# Patient Record
Sex: Female | Born: 1960 | Hispanic: No | Marital: Married | Smoking: Never smoker
Health system: Southern US, Community
[De-identification: ages and names within clinical notes are randomized; demographics above are authoritative.]

## PROBLEM LIST (undated history)

## (undated) DIAGNOSIS — Z780 Asymptomatic menopausal state: Secondary | ICD-10-CM

## (undated) DIAGNOSIS — E041 Nontoxic single thyroid nodule: Secondary | ICD-10-CM

## (undated) HISTORY — DX: Asymptomatic menopausal state: Z78.0

## (undated) HISTORY — DX: Nontoxic single thyroid nodule: E04.1

---

## 2004-07-30 ENCOUNTER — Other Ambulatory Visit: Admission: RE | Admit: 2004-07-30 | Discharge: 2004-07-30 | Payer: Self-pay | Admitting: Obstetrics and Gynecology

## 2005-08-05 ENCOUNTER — Other Ambulatory Visit: Admission: RE | Admit: 2005-08-05 | Discharge: 2005-08-05 | Payer: Self-pay | Admitting: Obstetrics and Gynecology

## 2008-05-13 ENCOUNTER — Ambulatory Visit: Payer: Self-pay | Admitting: Interventional Radiology

## 2008-05-13 ENCOUNTER — Emergency Department (HOSPITAL_BASED_OUTPATIENT_CLINIC_OR_DEPARTMENT_OTHER): Admission: EM | Admit: 2008-05-13 | Discharge: 2008-05-13 | Payer: Self-pay | Admitting: Emergency Medicine

## 2009-11-24 ENCOUNTER — Emergency Department (HOSPITAL_BASED_OUTPATIENT_CLINIC_OR_DEPARTMENT_OTHER): Admission: EM | Admit: 2009-11-24 | Discharge: 2009-11-24 | Payer: Self-pay | Admitting: Emergency Medicine

## 2009-11-24 ENCOUNTER — Ambulatory Visit: Payer: Self-pay | Admitting: Radiology

## 2009-11-28 IMAGING — CR DG HAND COMPLETE 3+V*R*
3 series · 3 of 3 positions shown · non-contrast
Comparison: None.

CLINICAL DATA: Fall, right wrist and hand pain

RIGHT HAND - COMPLETE 3+ VIEW

[x hand pa right]
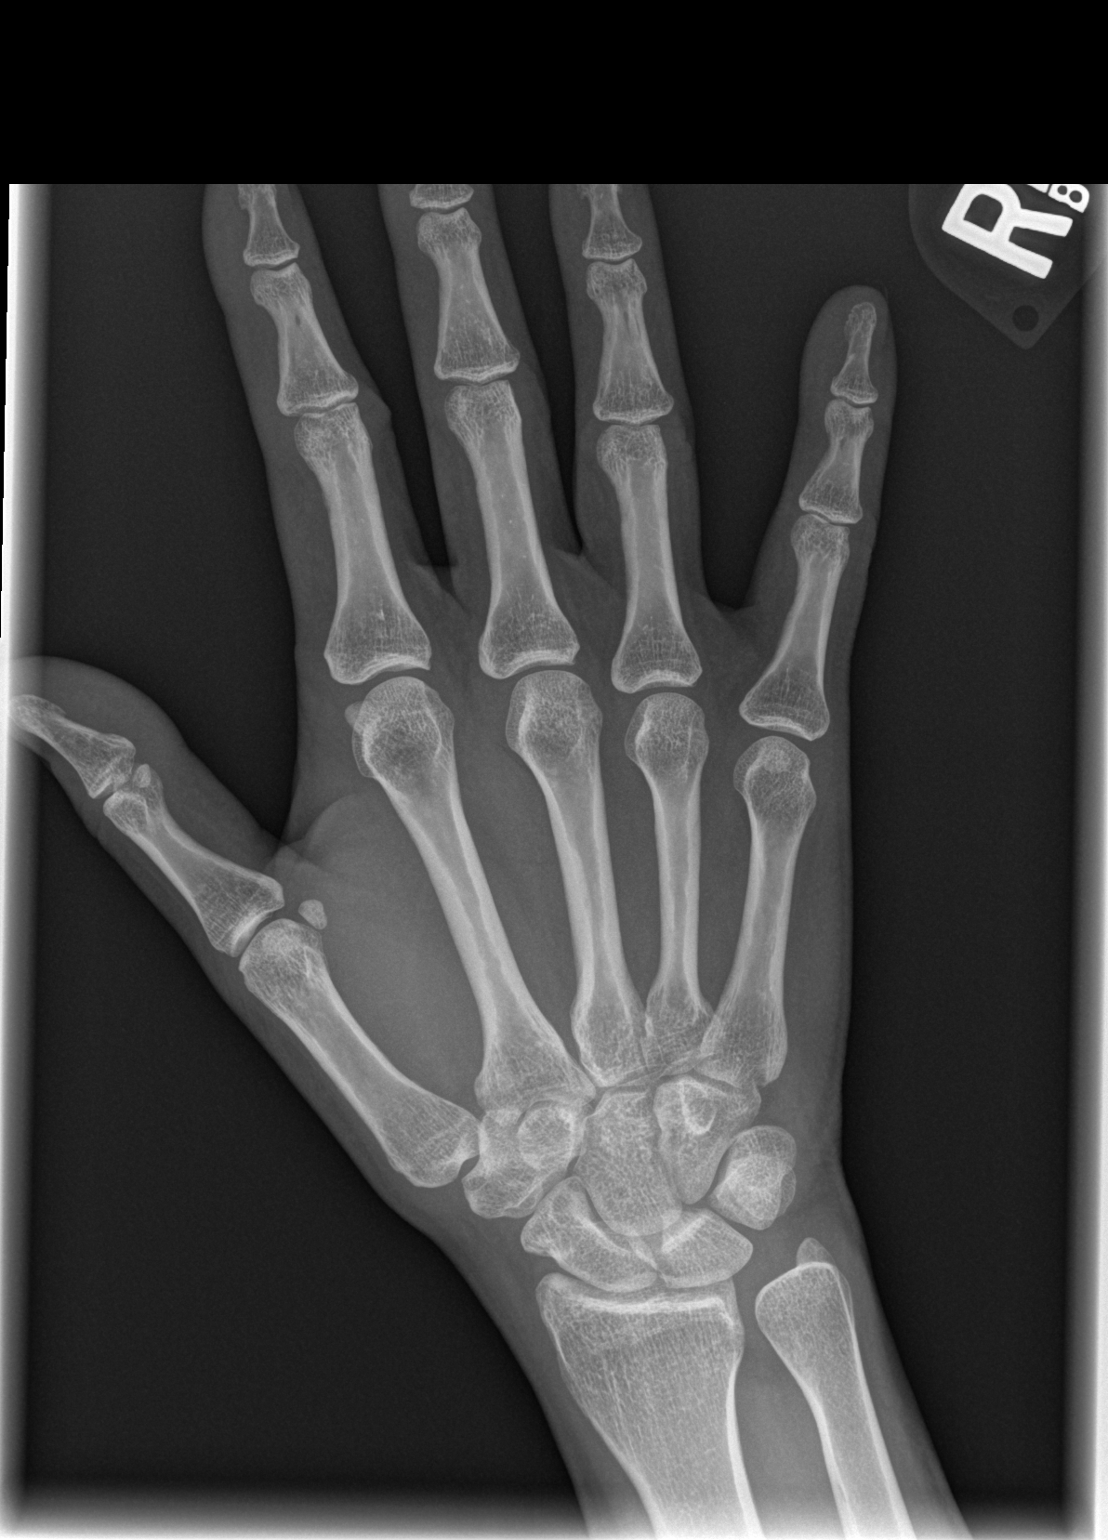

[x hand oblique right]
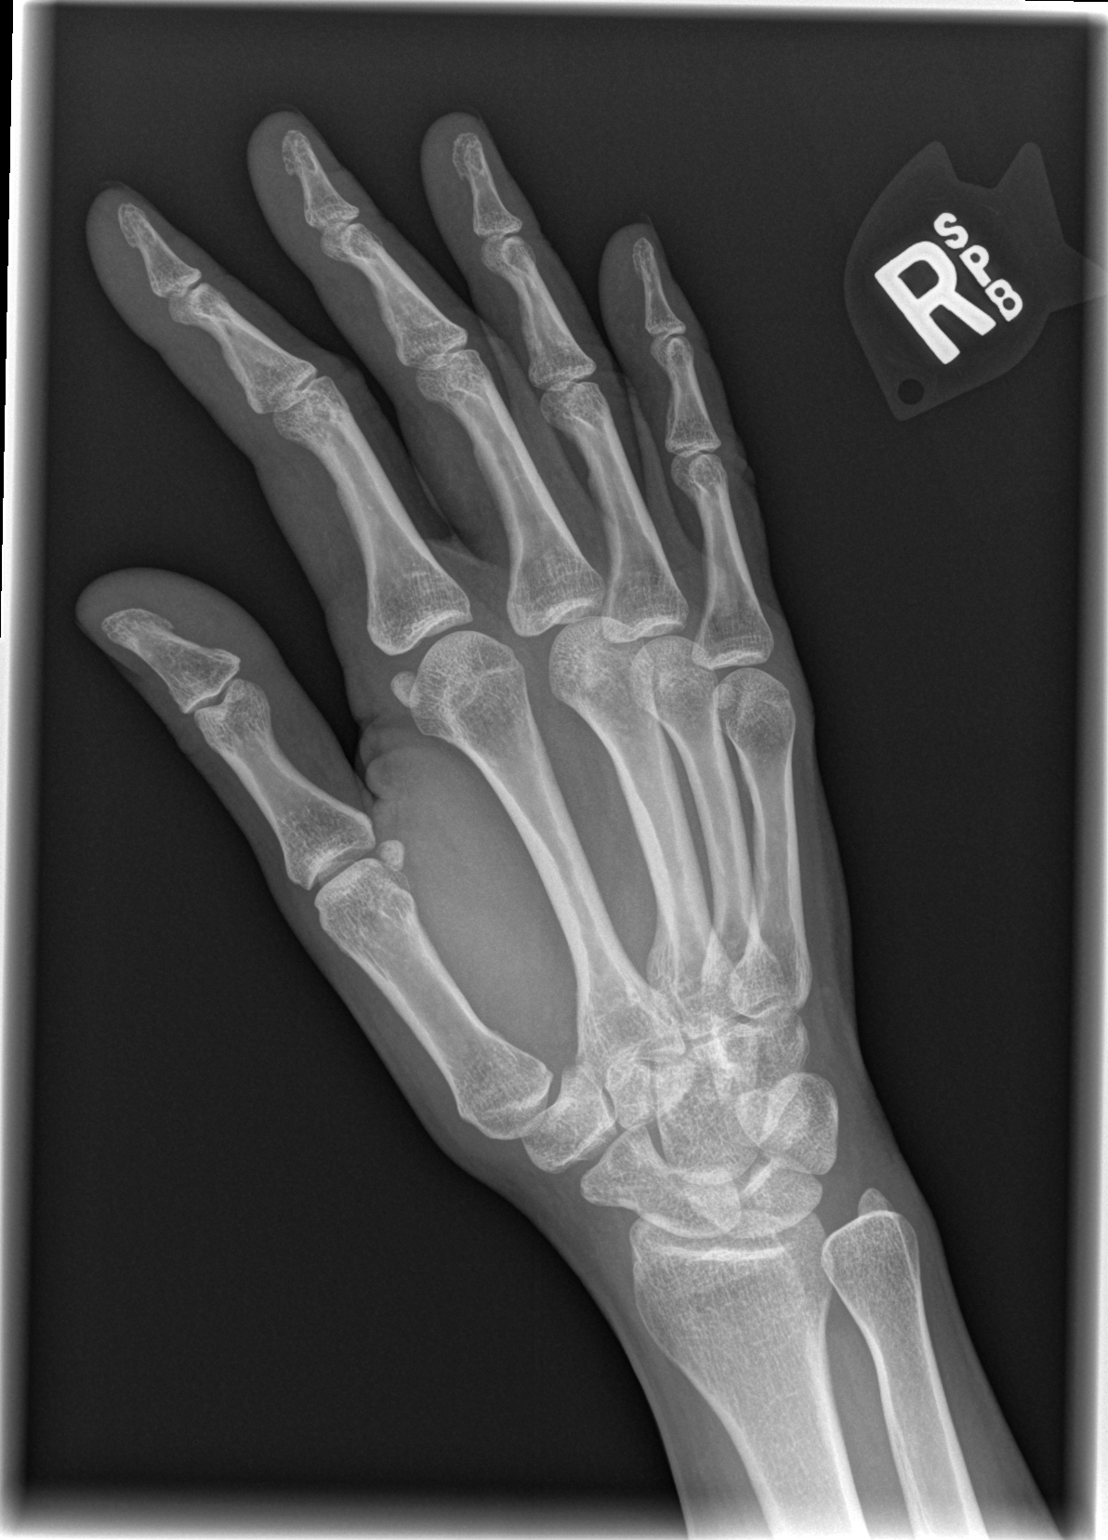

[x hand lat right]
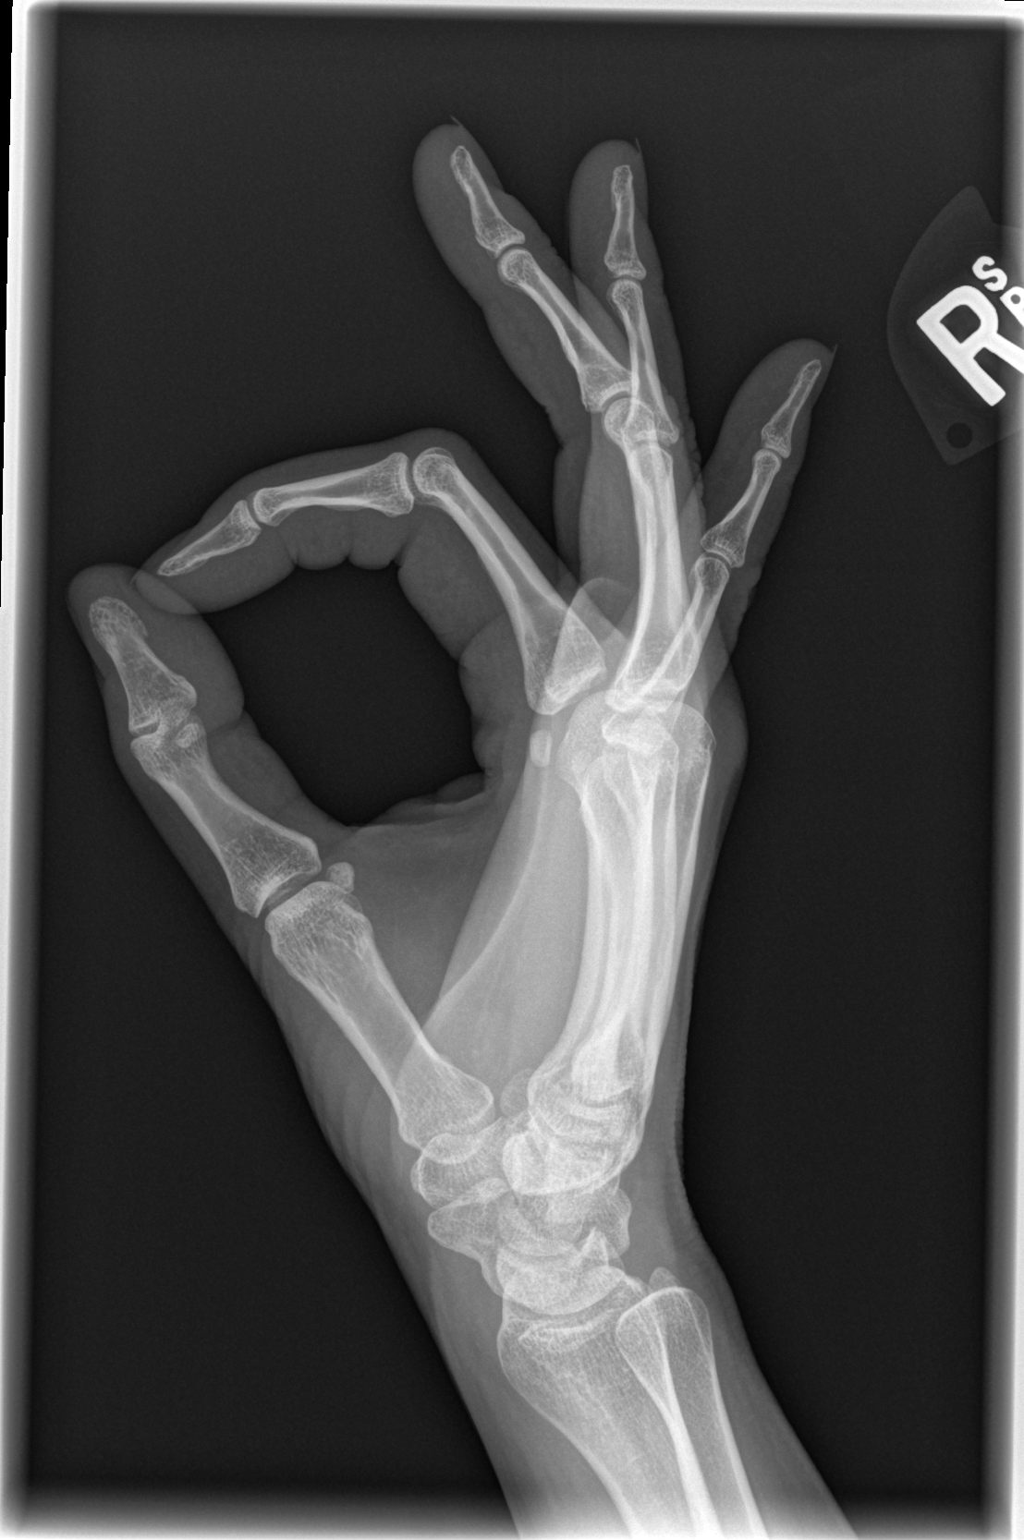

[3 of 3 positions shown; findings below may reference images not displayed]

FINDINGS: Normal alignment without fracture.  Preserved joint
spaces.  No radiographic swelling or foreign body.
IMPRESSION: No acute bony abnormality.

## 2009-11-28 IMAGING — CR DG WRIST COMPLETE 3+V*R*
3 series · 3 of 3 positions shown · non-contrast
Comparison: Same date

CLINICAL DATA: Fall, right wrist and hand pain

RIGHT WRIST - COMPLETE 3+ VIEW

[x wrist pa right]
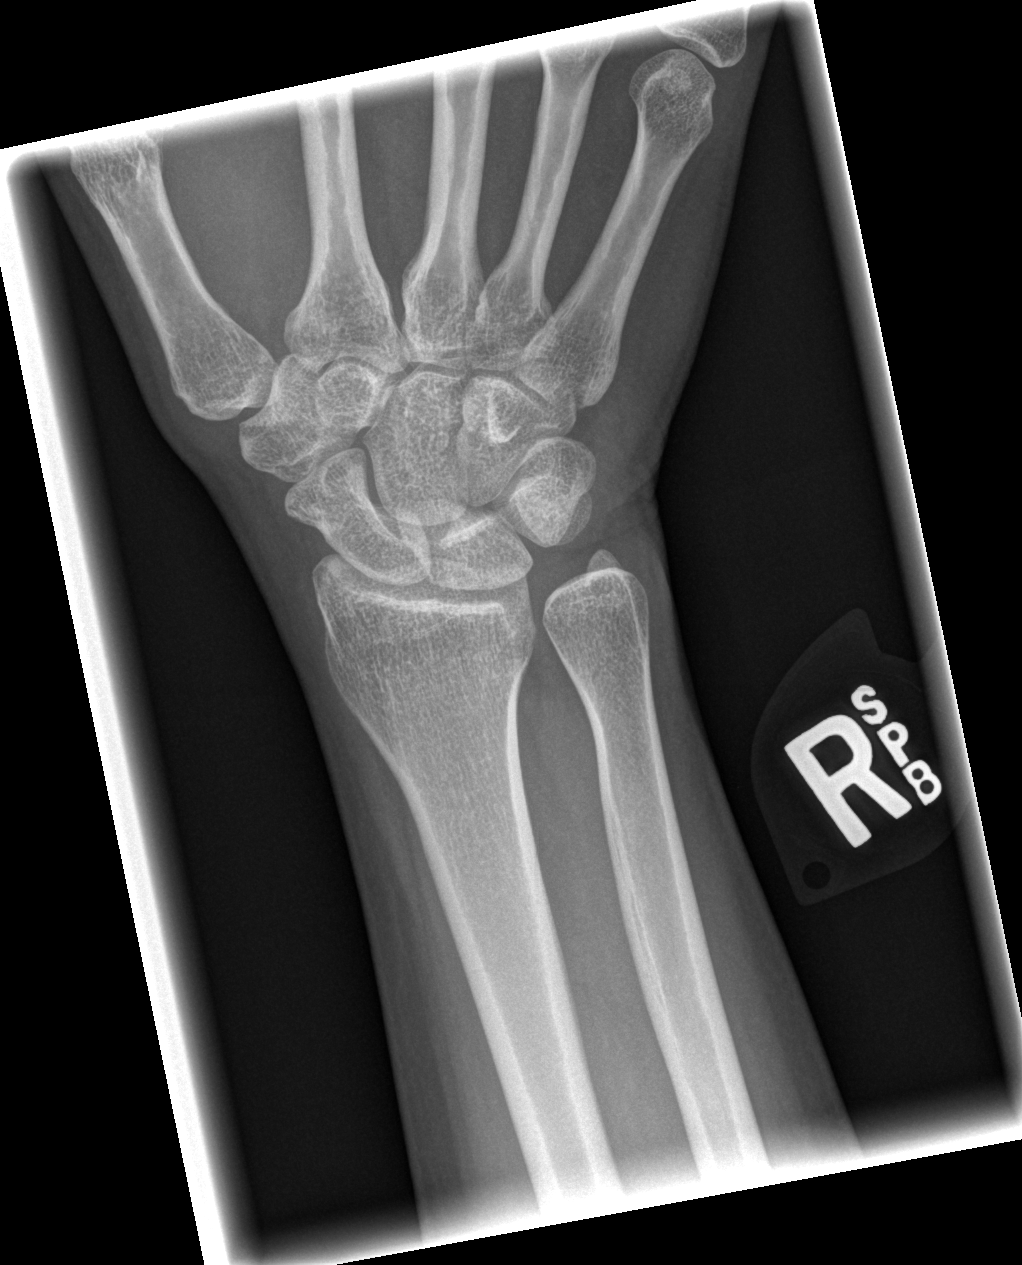

[x wrist obl right]
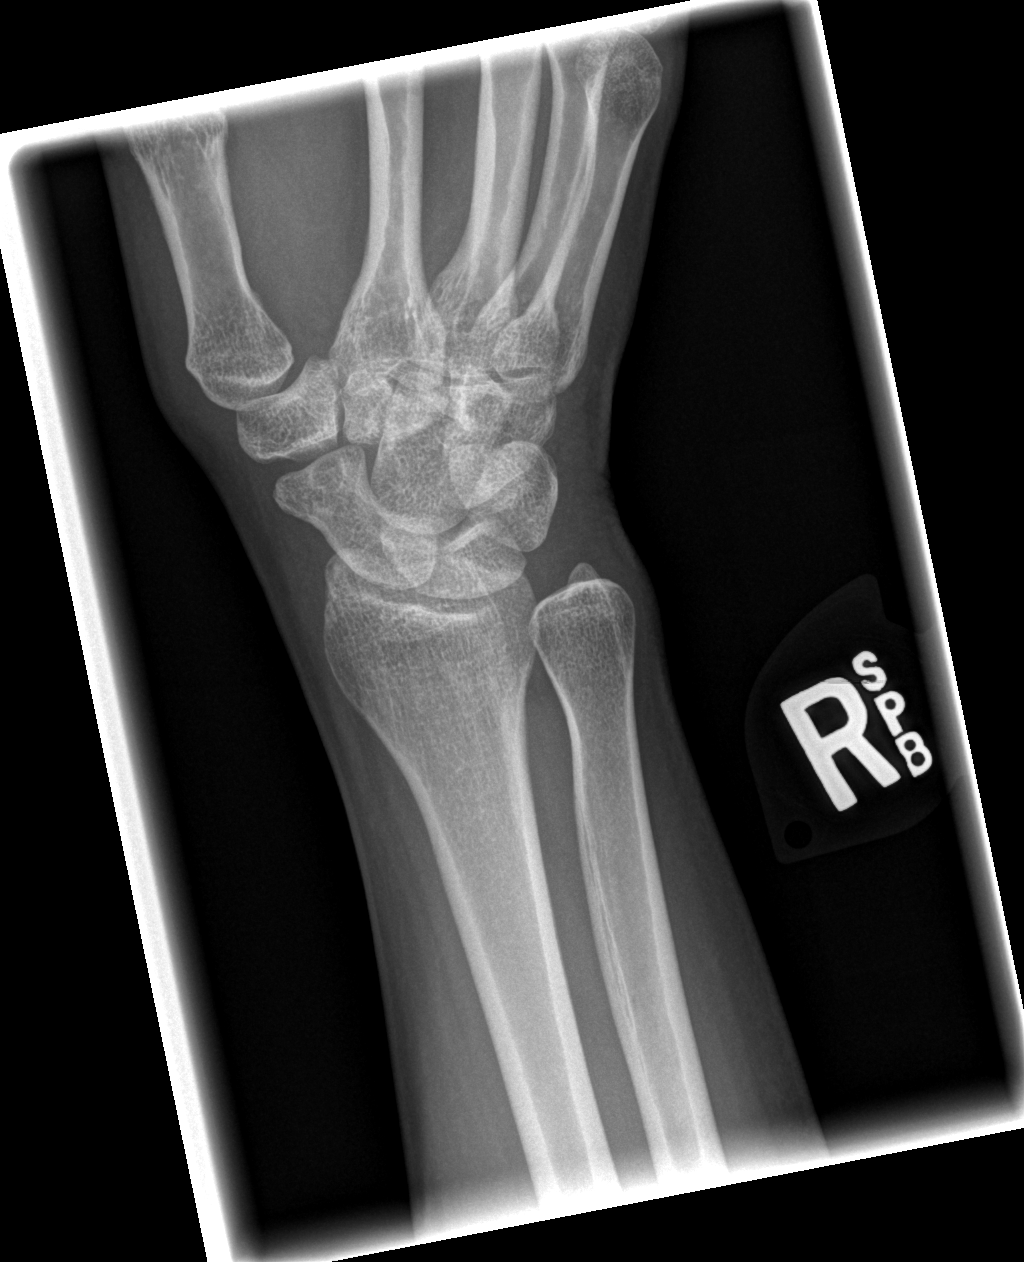

[x wrist lat right]
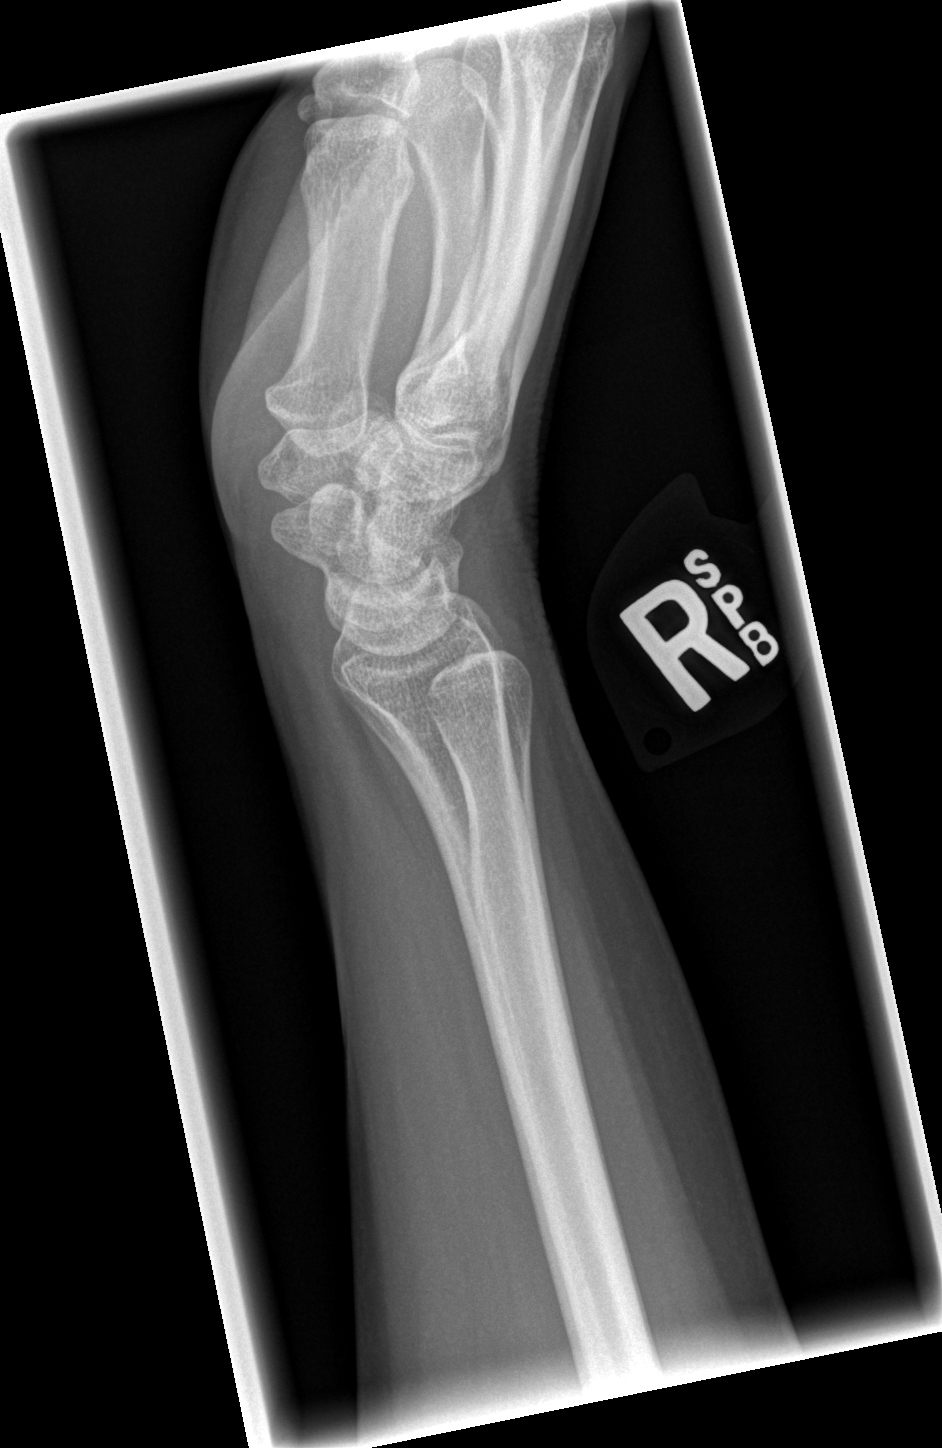

[3 of 3 positions shown; findings below may reference images not displayed]

FINDINGS: Normal alignment without fracture.  No radiographic
swelling or foreign body.  No significant arthritic or degenerative
change.
IMPRESSION: No acute bony abnormality

## 2010-01-09 ENCOUNTER — Ambulatory Visit: Payer: Self-pay | Admitting: Genetic Counselor

## 2010-07-25 ENCOUNTER — Encounter: Payer: Self-pay | Admitting: Internal Medicine

## 2010-07-25 ENCOUNTER — Ambulatory Visit (INDEPENDENT_AMBULATORY_CARE_PROVIDER_SITE_OTHER): Payer: BC Managed Care – PPO | Admitting: Internal Medicine

## 2010-07-25 DIAGNOSIS — D649 Anemia, unspecified: Secondary | ICD-10-CM | POA: Insufficient documentation

## 2010-07-25 DIAGNOSIS — M26609 Unspecified temporomandibular joint disorder, unspecified side: Secondary | ICD-10-CM

## 2010-07-25 DIAGNOSIS — J309 Allergic rhinitis, unspecified: Secondary | ICD-10-CM | POA: Insufficient documentation

## 2010-07-25 DIAGNOSIS — H6590 Unspecified nonsuppurative otitis media, unspecified ear: Secondary | ICD-10-CM | POA: Insufficient documentation

## 2010-07-30 NOTE — Assessment & Plan Note (Signed)
Summary: new acute ear problem/bcbs federal/ok per chrae/kn   Vital Signs:  Patient profile:   50 year old female Weight:      167 pounds Temp:     98.3 degrees F oral Pulse rate:   64 / minute Resp:     12 per minute BP sitting:   140 / 82  (left arm) Cuff size:   large  Vitals Entered By: Shonna Chock CMA (July 25, 2010 3:51 PM) CC: New patient Acute: Left side jaw pain up into left ear , Ear pain   CC:  New patient Acute: Left side jaw pain up into left ear  and Ear pain.  History of Present Illness:    Onset of dull pain in front of L ear 2 weeks ago w/o trigger or injury. She  reports sensation of fullness, tinnitus as intermittent buzzing, and jaw click, but denies ear discharge, hearing loss, fever, sinus pain, and nasal discharge.  The pain is described as constant and severe since she ate an apple 10 days ago.  The patient denies popping or crackling sounds, toothache, dizziness, and vertigo.  Prior treatment has included  use of Q Tip.  Preventive Screening-Counseling & Management  Alcohol-Tobacco     Smoking Status: never  Caffeine-Diet-Exercise     Does Patient Exercise: yes  Current Medications (verified): 1)  Kariva 0.15-0.02/0.01 Mg (21/5) Tabs (Desogestrel-Ethinyl Estradiol) .... As Directed  Allergies (verified): 1)  ! Pcn  Past History:  Past Medical History: Allergic rhinitis, PMH of in chilfdhood Anemia, Blood Donor  Past Surgical History: Tonsillectomy; G 1 P 1  Family History: Father: Living, bladder cancer Mother: Living, DM, HTN Siblings: 1 sister: Deceased Ovarian cancer; MGM : Ovarian cancer  Social History: Occupation: Therapist, music) Married Never Smoked Alcohol use-yes: socially Regular exercise-yes: walking 3X/ week Smoking Status:  never Does Patient Exercise:  yes  Physical Exam  General:  Thin but well-nourished,in no acute distress; alert,appropriate and cooperative throughout  examination Head:  Normocephalic and atraumatic without obvious abnormalities.  Eyes:  No corneal or conjunctival inflammation noted. EOMI. Perrla. No nystagmus Ears:  External ear exam shows no significant lesions or deformities.  Otoscopic examination reveals clear canals, tympanic membranes are intact bilaterally without bulging, retraction, inflammation or discharge. Hearing is grossly normal bilaterally to whisper @ 6 ft. L TM dull.Rinne normal (BC>AC) and Weber abnormal, not heard on L.   Nose:  External nasal examination shows no deformity or inflammation. Nasal mucosa are pink and moist without lesions or exudates. Mouth:  Oral mucosa and oropharynx without lesions or exudates.  Teeth in good repair. Classic TMJ on L Neck:  No deformities, masses, or tenderness noted. Thyroid ULN size w/o nodules Lungs:  Normal respiratory effort, chest expands symmetrically. Lungs are clear to auscultation, no crackles or wheezes. Heart:  Normal rate and regular rhythm. S1 and S2 normal without gallop, murmur, click, rub .S4 Pulses:  R and L carotid  pulses are full and equal bilaterally Neurologic:  alert & oriented X3, gait normal, finger-to-nose normal, and Romberg negative.   Skin:  Intact without suspicious lesions or rashes Cervical Nodes:  No lymphadenopathy noted Axillary Nodes:  No palpable lymphadenopathy Psych:  memory intact for recent and remote, normally interactive, and good eye contact.     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 As directed 2)  Tramadol Hcl 50 Mg Tabs (Tramadol hcl) .Marland Kitchen.. 1 every 6 hrs as needed for pain 3)  Fluticasone Propionate 50 Mcg/act Susp (Fluticasone propionate) .Marland Kitchen.. 1 spray two times a day as needed  Patient  Instructions: 1)  Soft /liquid diet until pain gone. Take Aleve 1-2 every 8 -12 hrs with food  as needed for jaw pain. Glucosamine sulfate 1500 mg once daily for 3-4 weeks. Warm , moist compresses 3-4X/ day as needed .  Prescriptions: FLUTICASONE PROPIONATE 50 MCG/ACT SUSP (FLUTICASONE PROPIONATE) 1 spray two times a day as needed  #1 x 2   Entered and Authorized by:   Marga Melnick MD   Signed by:   Marga Melnick MD on 07/25/2010   Method used:   Electronically to        [facility] (retail)       [address]       [place], [place]  [address]       Ph: [phone]       Fax: [phone]   RxID:   [id number] TRAMADOL HCL 50 MG TABS (TRAMADOL HCL) 1 every 6 hrs as needed for pain  #30 x 0   Entered and Authorized by:   Marga Melnick MD   Signed by:   Marga Melnick MD on 07/25/2010   Method used:   Electronically to        [facility] (retail)       [address]       [place], [place]  [address]       Ph: [phone]       Fax: [phone]   RxID:   [id number]    Orders Added: 1)  New Patient Level III [phone]

## 2010-10-08 ENCOUNTER — Other Ambulatory Visit: Payer: Self-pay | Admitting: Obstetrics and Gynecology

## 2010-10-08 DIAGNOSIS — R928 Other abnormal and inconclusive findings on diagnostic imaging of breast: Secondary | ICD-10-CM

## 2010-10-13 ENCOUNTER — Ambulatory Visit
Admission: RE | Admit: 2010-10-13 | Discharge: 2010-10-13 | Disposition: A | Discharge status: Home / Self Care | Payer: BC Managed Care – PPO | Source: Ambulatory Visit | Attending: Obstetrics and Gynecology | Admitting: Obstetrics and Gynecology

## 2010-10-13 DIAGNOSIS — R928 Other abnormal and inconclusive findings on diagnostic imaging of breast: Secondary | ICD-10-CM

## 2010-11-14 HISTORY — PX: COLONOSCOPY: SHX174

## 2011-03-31 ENCOUNTER — Other Ambulatory Visit: Payer: Self-pay | Admitting: Internal Medicine

## 2011-03-31 DIAGNOSIS — R221 Localized swelling, mass and lump, neck: Secondary | ICD-10-CM

## 2011-04-03 ENCOUNTER — Ambulatory Visit
Admission: RE | Admit: 2011-04-03 | Discharge: 2011-04-03 | Disposition: A | Discharge status: Home / Self Care | Payer: BC Managed Care – PPO | Source: Ambulatory Visit | Attending: Internal Medicine | Admitting: Internal Medicine

## 2011-04-03 DIAGNOSIS — R221 Localized swelling, mass and lump, neck: Secondary | ICD-10-CM

## 2012-04-06 ENCOUNTER — Other Ambulatory Visit: Payer: Self-pay | Admitting: Internal Medicine

## 2012-04-06 DIAGNOSIS — E041 Nontoxic single thyroid nodule: Secondary | ICD-10-CM

## 2012-04-12 ENCOUNTER — Ambulatory Visit
Admission: RE | Admit: 2012-04-12 | Discharge: 2012-04-12 | Disposition: A | Discharge status: Home / Self Care | Payer: Federal, State, Local not specified - PPO | Source: Ambulatory Visit | Attending: Internal Medicine | Admitting: Internal Medicine

## 2012-04-12 DIAGNOSIS — E041 Nontoxic single thyroid nodule: Secondary | ICD-10-CM

## 2012-04-19 ENCOUNTER — Other Ambulatory Visit: Payer: Self-pay | Admitting: Internal Medicine

## 2012-04-19 DIAGNOSIS — E041 Nontoxic single thyroid nodule: Secondary | ICD-10-CM

## 2012-04-21 ENCOUNTER — Ambulatory Visit
Admission: RE | Admit: 2012-04-21 | Discharge: 2012-04-21 | Disposition: A | Discharge status: Home / Self Care | Payer: Federal, State, Local not specified - PPO | Source: Ambulatory Visit | Attending: Internal Medicine | Admitting: Internal Medicine

## 2012-04-21 DIAGNOSIS — E041 Nontoxic single thyroid nodule: Secondary | ICD-10-CM

## 2012-05-02 ENCOUNTER — Other Ambulatory Visit: Payer: Self-pay | Admitting: Dermatology

## 2012-06-09 ENCOUNTER — Other Ambulatory Visit: Payer: Self-pay | Admitting: Dermatology

## 2013-05-02 ENCOUNTER — Other Ambulatory Visit: Payer: Self-pay | Admitting: Dermatology

## 2013-11-06 IMAGING — US US THYROID BIOPSY
1 series · 4 of 4 positions shown · non-contrast
Comparison: 04/12/2012 and 04/03/2011.

CLINICAL DATA: Sonographic surveillance shows enlarging right
thyroid nodules.

ULTRASOUND GUIDED NEEDLE ASPIRATE BIOPSY OF THE THYROID GLAND

[Series 1: us thyroid biopsy · 0.05mm/px · 4 acquisitions, 4 frames shown]
[im 1/4]
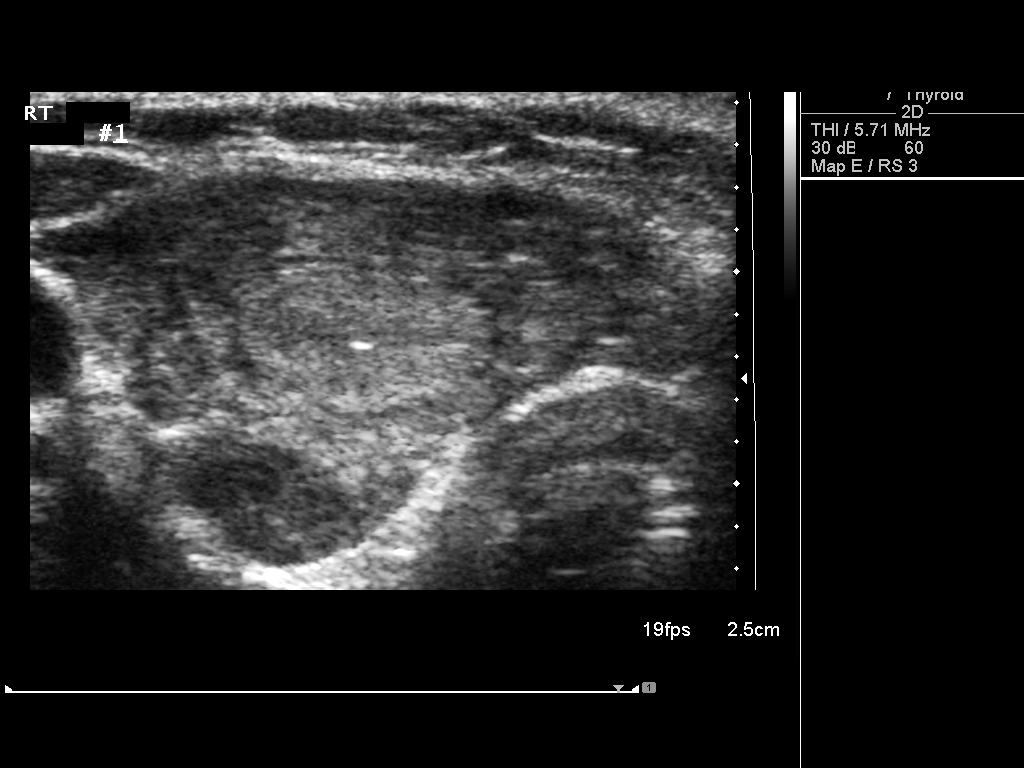
[im 2/4]
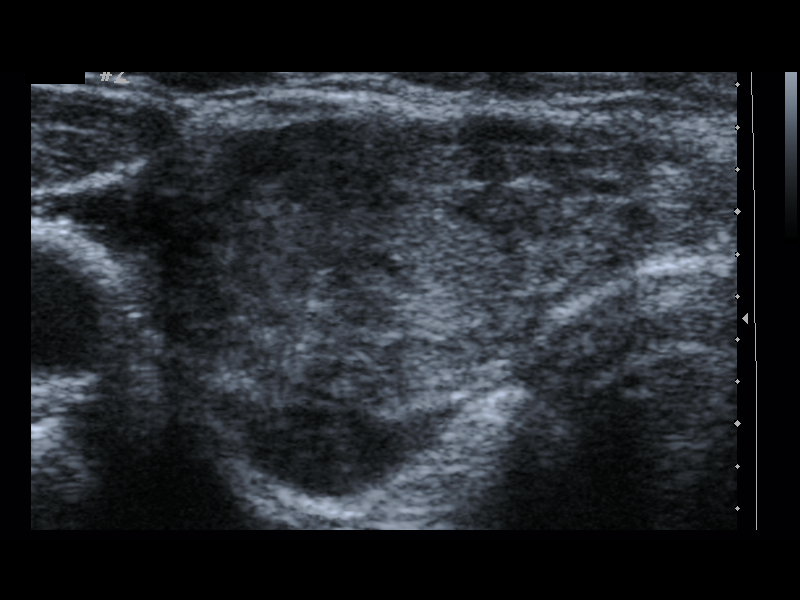
[im 3/4]
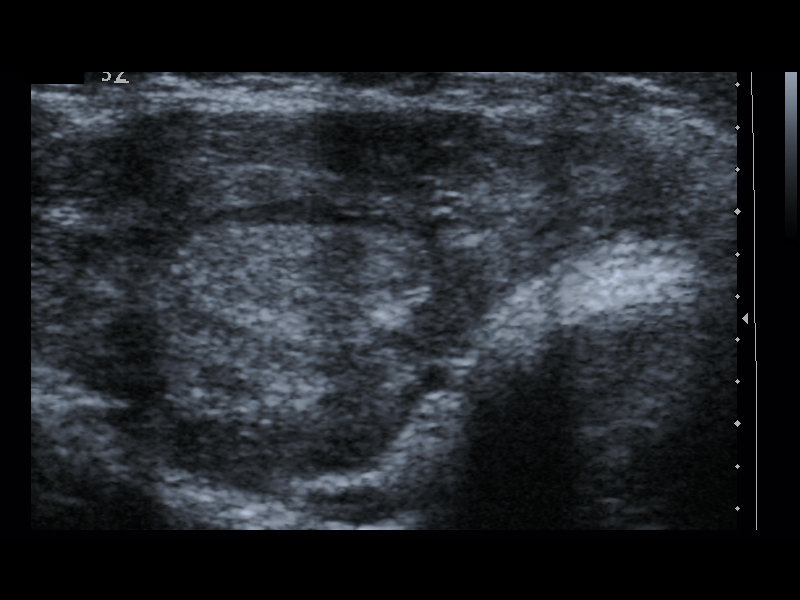
[im 4/4]
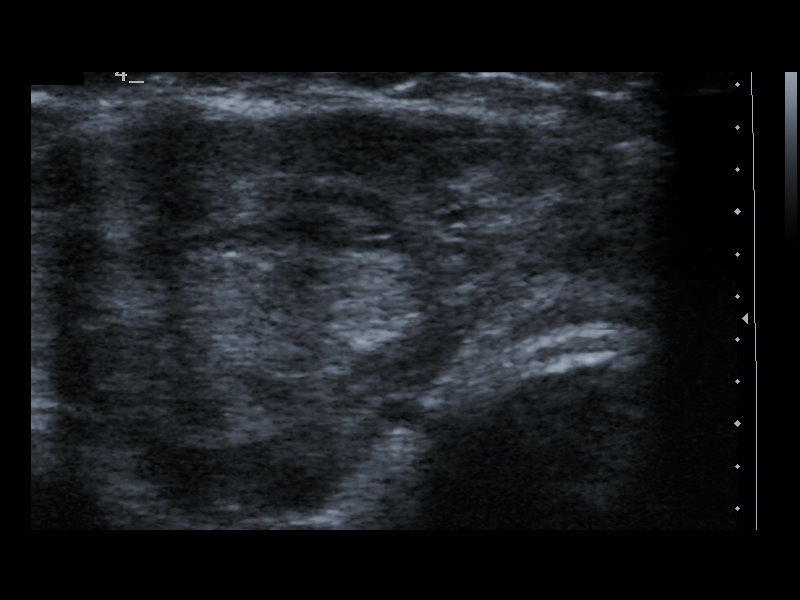

[4 of 4 positions shown; findings below may reference images not displayed]

Thyroid biopsy was thoroughly discussed with the patient and
questions were answered.  The benefits, risks, alternatives, and
complications were also discussed.  The patient understands and
wishes to proceed with the procedure.  Written consent was
obtained.

Ultrasound was performed to localize and mark an adequate site for
the biopsy.  The patient was then prepped and draped in a normal
sterile fashion.  Local anesthesia was provided with 1% lidocaine.
Using direct ultrasound guidance, 4 passes were made using 25 gauge
needles into the nodule within the right lobe of the thyroid.
Ultrasound was used to confirm needle placements on all occasions.
Specimens were sent to Pathology for analysis.

Complications:  None
FINDINGS: Preliminary ultrasound was performed with measurements
made of both superior and inferior right thyroid nodules.  The
right superior thyroid nodule shows mostly cystic degeneration and
a solid mural component.  This nodule upon remeasurement clearly
has not enlarged over time and is actually smaller than on the
04/03/2011 study.  Current measurements are approximately 1.6 x
x 1.6 cm.

The completely solid and ovoid nodule in the inferior right lobe
measures approximately 2.1 x 0.9 x 1.3 cm.  This does represent
slight enlargement since 6666 and therefore biopsy was indicated.
Needle aspirate samples were obtained in different portions of the
nodule.
IMPRESSION: Ultrasound guided needle aspirate biopsy performed of the right
inferior thyroid nodule.  As above, repeat measurement of the
mostly cystic superior right thyroid nodule shows no enlargement
over time and slightly decreased dimensions since 6666.  Biopsy of
this nodule was therefore not performed.

## 2013-11-07 ENCOUNTER — Other Ambulatory Visit: Payer: Self-pay | Admitting: Internal Medicine

## 2013-11-07 DIAGNOSIS — R42 Dizziness and giddiness: Secondary | ICD-10-CM

## 2013-11-14 ENCOUNTER — Ambulatory Visit
Admission: RE | Admit: 2013-11-14 | Discharge: 2013-11-14 | Disposition: A | Discharge status: Home / Self Care | Payer: Federal, State, Local not specified - PPO | Source: Ambulatory Visit | Attending: Internal Medicine | Admitting: Internal Medicine

## 2013-11-14 DIAGNOSIS — R42 Dizziness and giddiness: Secondary | ICD-10-CM

## 2013-11-14 MED ORDER — GADOBENATE DIMEGLUMINE 529 MG/ML IV SOLN
15.0000 mL | Freq: Once | INTRAVENOUS | Status: AC | PRN
  Administered 2013-11-14: 15 mL via INTRAVENOUS

## 2013-11-17 ENCOUNTER — Encounter: Payer: Self-pay | Admitting: *Deleted

## 2013-11-20 ENCOUNTER — Ambulatory Visit (INDEPENDENT_AMBULATORY_CARE_PROVIDER_SITE_OTHER): Payer: Federal, State, Local not specified - PPO | Admitting: Neurology

## 2013-11-20 ENCOUNTER — Encounter: Payer: Self-pay | Admitting: Neurology

## 2013-11-20 ENCOUNTER — Encounter (INDEPENDENT_AMBULATORY_CARE_PROVIDER_SITE_OTHER): Payer: Self-pay

## 2013-11-20 VITALS — BP 152/86 | HR 68 | Ht 67.0 in | Wt 164.0 lb

## 2013-11-20 DIAGNOSIS — R5381 Other malaise: Secondary | ICD-10-CM

## 2013-11-20 DIAGNOSIS — H02409 Unspecified ptosis of unspecified eyelid: Secondary | ICD-10-CM

## 2013-11-20 DIAGNOSIS — R5383 Other fatigue: Secondary | ICD-10-CM

## 2013-11-20 DIAGNOSIS — R531 Weakness: Secondary | ICD-10-CM

## 2013-11-20 NOTE — Progress Notes (Signed)
[facility]    Provider:  Dr Hosie PoissonSumner Referring Provider: Georgianne Fickamachandran, Ajith, MD Primary Care Physician:  Georgianne FickAMACHANDRAN,AJITH, MD  CC:  Right eye drooping  HPI:  Hosie PoissonLinda M White is a 53 y.o. female here as a referral from Dr. Nicholos Johnsamachandran for ptosis and vertigo  In April she felt some heaviness in her right eyes, saw eye doctor and told everything looks good with the eye. Had brain MRI which was unremarkable. Feels pressure behind the eye, she feels the eyelid is drooping more than it usually does. Notes that the drooping of the eyelid is constant throughout the day. No eye pain. Notes some mild conjunctival injection, no tearing up or discharge. No associated headaches. She does note some increased fatigue and muscle weakness.   Has vertigo, started around 2-3 weeks ago. Associated with movement, notes it is worse with looking down. Comes and goes, will last just a few seconds when it occurs.   No recent illnesses, fevers, infections. No tick bites, no abnormal rashes. Otherwise healthy. She does note a history of Goiter in the past, has not had her thyroid checked in over 2 years  No family history of rheumatological, neurological or autoimmune disorders.   MRI brain reviewed and was found to be unremarkable.   Review of Systems: Out of a complete 14 system review, the patient complains of only the following symptoms, and all other reviewed systems are negative. + dizziness, sleepiness   History   Social History  . Marital Status: Married    Spouse Name: N/A    Number of Children: N/A  . Years of Education: N/A   Occupational History  . Not on file.   Social History Main Topics  . Smoking status: Not on file  . Smokeless tobacco: Not on file  . Alcohol Use: Not on file  . Drug Use: Not on file  . Sexual Activity: Not on file   Other Topics Concern  . Not on file   Social History Narrative  . No narrative on file    Family History  Problem  Relation Age of Onset  . Diabetes Mother   . Cancer Sister     Ovarian    Past Medical History  Diagnosis Date  . Thyroid nodule   . Menopause     Past Surgical History  Procedure Laterality Date  . Colonoscopy  11/14/2010    Current Outpatient Prescriptions  Medication Sig Dispense Refill  . Cyanocobalamin (VITAMIN B 12 PO) Take 100 mg by mouth.      . Omega-3 Fatty Acids (FISH OIL) 1000 MG CAPS Take 1,000 mg by mouth.       No current facility-administered medications for this visit.    Allergies as of 11/20/2013 - Review Complete 10/13/2010  Allergen Reaction Noted  . Penicillins  07/25/2010    Vitals: There were no vitals taken for this visit. Last Weight:  Wt Readings from Last 1 Encounters:  07/25/10 167 lb (75.751 kg)   Last Height:   Ht Readings from Last 1 Encounters:  No data found for Ht     Physical exam: Exam: Gen: NAD, conversant Eyes: anicteric sclerae, moist conjunctivae HENT: Atraumatic, oropharynx clear Neck: Trachea midline; supple,  Lungs: CTA, no wheezing, rales, rhonic                          CV: RRR, no MRG Abdomen: Soft, non-tender;  Extremities: No peripheral edema  Skin: Normal temperature, no  rash,  Psych: Appropriate affect, pleasant  Neuro: MS: AA&Ox3, appropriately interactive, normal affect   Attention: WORLD backwards  Speech: fluent w/o paraphasic error  Memory: good recent and remote recall  CN: PERRL, EOMI no nystagmus, moderate ptosis OD, sensation intact to LT V1-V3 bilat, face symmetric, no weakness, hearing grossly intact, palate elevates symmetrically, shoulder shrug 5/5 bilat,  tongue protrudes midline, no fasiculations noted.  Motor: normal bulk and tone Strength: 5/5  In all extremities  Coord: rapid alternating and point-to-point (FNF, HTS) movements intact.  Reflexes: symmetrical, bilat downgoing toes  Sens: LT intact in all extremities  Gait: posture, stance, stride and arm-swing normal.  Tandem gait intact. Able to walk on heels and toes. Romberg absent.   Assessment:  After physical and neurologic examination, review of laboratory studies, imaging, neurophysiology testing and pre-existing records, assessment will be reviewed on the problem list.  Plan:  Treatment plan and additional workup will be reviewed under Problem List.  1)ptosis 2)vertigo  53 year old woman presenting for initial evaluation of right eye ptosis and vertigo. She has had unremarkable brain MRI. Unclear etiology of her symptoms. We'll check blood work for thyroid abnormality, myasthenia gravis, and other autoimmune or infectious etiology. If this workup is unremarkable would consider dedicated MRI of the orbits. History and clinical exam is not consistent with a Horner's syndrome. Vertigo appears to be benign positional vertigo, will refer patient to vestibular rehabilitation. Will followup once blood work completed.  Elspeth ChoPeter Mieka Leaton, DO  [facility] [address] [place], [place] [id number]  Phone [phone] Fax [phone]

## 2013-11-22 ENCOUNTER — Other Ambulatory Visit: Payer: Self-pay | Admitting: Neurology

## 2013-11-22 DIAGNOSIS — H02409 Unspecified ptosis of unspecified eyelid: Secondary | ICD-10-CM

## 2013-11-22 DIAGNOSIS — H539 Unspecified visual disturbance: Secondary | ICD-10-CM

## 2013-11-22 LAB — TSH: TSH: 2.4 u[IU]/mL (ref 0.450–4.500)

## 2013-11-22 LAB — MYASTHENIA GRAVIS EVALUATION
AChR Binding Ab, Serum: <0.03 nmol/L (ref 0.00–0.24)
Anti-striation Abs: NEGATIVE

## 2013-11-22 LAB — LYME, TOTAL AB TEST/REFLEX: Lyme IgG/IgM Ab: <0.91 {ISR} (ref 0.00–0.90)

## 2013-11-22 LAB — SEDIMENTATION RATE: Sed Rate: 2 mm/hr (ref 0–40)

## 2013-11-22 LAB — T4, FREE: Free T4: 1.08 ng/dL (ref 0.82–1.77)

## 2013-11-22 LAB — ANA W/REFLEX IF POSITIVE: Anti Nuclear Antibody(ANA): NEGATIVE

## 2013-11-23 NOTE — Progress Notes (Signed)
Quick Note:  Left message on patient's voice mail informing her of normal labs, and that MRI has been ordered and that she will be contacted for scheduling. ______

## 2013-11-29 ENCOUNTER — Other Ambulatory Visit: Payer: Federal, State, Local not specified - PPO

## 2013-11-30 ENCOUNTER — Ambulatory Visit (INDEPENDENT_AMBULATORY_CARE_PROVIDER_SITE_OTHER): Payer: Federal, State, Local not specified - PPO

## 2013-11-30 DIAGNOSIS — H02409 Unspecified ptosis of unspecified eyelid: Secondary | ICD-10-CM

## 2013-11-30 DIAGNOSIS — H539 Unspecified visual disturbance: Secondary | ICD-10-CM

## 2013-11-30 MED ORDER — GADOPENTETATE DIMEGLUMINE 469.01 MG/ML IV SOLN: 15.0000 mL | Freq: Once | INTRAVENOUS | Status: AC | PRN

## 2013-12-04 ENCOUNTER — Telehealth: Payer: Self-pay | Admitting: *Deleted

## 2013-12-04 NOTE — Telephone Encounter (Signed)
Spoke to patient and she is aware of MRI results. Patient has not started rehab yet but the order was sent. Told patient if she did not hear anything within the next week to please call the office back.

## 2013-12-04 NOTE — Telephone Encounter (Signed)
Message copied by Ardeth SportsmanHODES, Shakaya Bhullar L on Mon Dec 04, 2013  3:55 PM ------      Message from: Ramond MarrowSUMNER, PETER J      Created: Mon Dec 04, 2013  3:33 PM       Please let her know her MRI was normal. She should continue with vestibular rehab and plan to follow up once that is completed. ------

## 2013-12-15 ENCOUNTER — Ambulatory Visit: Payer: Federal, State, Local not specified - PPO | Attending: Neurology | Admitting: Physical Therapy

## 2013-12-15 DIAGNOSIS — H8110 Benign paroxysmal vertigo, unspecified ear: Secondary | ICD-10-CM | POA: Diagnosis not present

## 2013-12-15 DIAGNOSIS — IMO0001 Reserved for inherently not codable concepts without codable children: Secondary | ICD-10-CM | POA: Diagnosis present

## 2013-12-15 DIAGNOSIS — H02409 Unspecified ptosis of unspecified eyelid: Secondary | ICD-10-CM | POA: Diagnosis not present

## 2013-12-22 ENCOUNTER — Ambulatory Visit: Payer: Federal, State, Local not specified - PPO | Admitting: Physical Therapy

## 2013-12-22 DIAGNOSIS — IMO0001 Reserved for inherently not codable concepts without codable children: Secondary | ICD-10-CM | POA: Diagnosis not present

## 2014-01-05 ENCOUNTER — Ambulatory Visit: Payer: Federal, State, Local not specified - PPO | Admitting: Physical Therapy

## 2014-05-23 ENCOUNTER — Other Ambulatory Visit: Payer: Self-pay | Admitting: Internal Medicine

## 2014-05-23 DIAGNOSIS — M542 Cervicalgia: Secondary | ICD-10-CM

## 2014-06-04 ENCOUNTER — Ambulatory Visit
Admission: RE | Admit: 2014-06-04 | Discharge: 2014-06-04 | Disposition: A | Discharge status: Home / Self Care | Payer: Federal, State, Local not specified - PPO | Source: Ambulatory Visit | Attending: Internal Medicine | Admitting: Internal Medicine

## 2014-06-04 DIAGNOSIS — M542 Cervicalgia: Secondary | ICD-10-CM

## 2014-06-11 ENCOUNTER — Other Ambulatory Visit: Payer: Self-pay | Admitting: Internal Medicine

## 2014-06-11 DIAGNOSIS — M5030 Other cervical disc degeneration, unspecified cervical region: Secondary | ICD-10-CM

## 2014-07-12 ENCOUNTER — Ambulatory Visit
Admission: RE | Admit: 2014-07-12 | Discharge: 2014-07-12 | Disposition: A | Discharge status: Home / Self Care | Payer: PRIVATE HEALTH INSURANCE | Source: Ambulatory Visit | Attending: Internal Medicine | Admitting: Internal Medicine

## 2014-07-12 VITALS — BP 146/86 | HR 66

## 2014-07-12 DIAGNOSIS — M5030 Other cervical disc degeneration, unspecified cervical region: Secondary | ICD-10-CM

## 2014-07-12 MED ORDER — TRIAMCINOLONE ACETONIDE 40 MG/ML IJ SUSP (RADIOLOGY)
60.0000 mg | Freq: Once | INTRAMUSCULAR | Status: AC
  Administered 2014-07-12: 60 mg via EPIDURAL

## 2014-07-12 MED ORDER — IOHEXOL 300 MG/ML  SOLN
1.0000 mL | Freq: Once | INTRAMUSCULAR | Status: AC | PRN
  Administered 2014-07-12: 1 mL via EPIDURAL

## 2014-07-12 NOTE — Discharge Instructions (Signed)

## 2014-07-26 ENCOUNTER — Encounter: Payer: Self-pay | Admitting: Physical Therapy

## 2014-07-26 ENCOUNTER — Ambulatory Visit: Payer: PRIVATE HEALTH INSURANCE | Attending: Internal Medicine | Admitting: Physical Therapy

## 2014-07-26 DIAGNOSIS — M542 Cervicalgia: Secondary | ICD-10-CM | POA: Insufficient documentation

## 2014-07-26 DIAGNOSIS — M25511 Pain in right shoulder: Secondary | ICD-10-CM | POA: Insufficient documentation

## 2014-07-26 DIAGNOSIS — M5030 Other cervical disc degeneration, unspecified cervical region: Secondary | ICD-10-CM | POA: Insufficient documentation

## 2014-07-26 NOTE — Therapy (Signed)
[facility] [address] [place], [place], [address] Phone: [phone]   Fax:  [phone]  Physical Therapy Evaluation  Patient Details  Name: Cheyenne White MRN: [id number] Date of Birth: Oct 14, 1960 Referring Provider:  Georgianne Fick, MD  Encounter Date: 07/26/2014      PT End of Session - 07/26/14 1501    Visit Number 1   Number of Visits 16   Date for PT Re-Evaluation 09/24/14   PT Start Time 1438   PT Stop Time 1540   PT Time Calculation (min) 62 min   Behavior During Therapy [facility] for tasks assessed/performed      Past Medical History  Diagnosis Date  . Thyroid nodule   . Menopause     Past Surgical History  Procedure Laterality Date  . Colonoscopy  11/14/2010    LMP 09/15/2010  Visit Diagnosis:  Neck pain - Plan: PT plan of care cert/re-cert  Pain in joint, shoulder region, right - Plan: PT plan of care cert/re-cert      Subjective Assessment - 07/26/14 1445    Symptoms C/O neck pain since 2009, she reports that she was walking and stepped in a pot hole and fell onto her right hand.  She reports that she was treated for hand and elbow injuries, but started having some increased pain in the  right sholder and neck   Pertinent History 7 years of neck pain she is right handed   Limitations Reading;Sitting;House hold activities   Patient Stated Goals no pain in the neck and shoulder   Currently in Pain? Yes   Pain Score 8    Pain Location Neck   Pain Orientation Right   Pain Descriptors / Indicators Constant;Burning;Tingling   Pain Type Chronic pain   Pain Radiating Towards right shoulder blade into the right arm to the wrist   Pain Onset Other (comment)   Pain Frequency Constant   Aggravating Factors  sitting   Pain Relieving Factors yoga helps, massage helps          [facility] PT Assessment - 07/26/14 0001    Assessment   Medical Diagnosis cervical DDD   Next MD Visit none schedules     Prior Therapy none for the neck    Precautions   Precautions None   Balance Screen   Has the patient fallen in the past 6 months No   Has the patient had a decrease in activity level because of a fear of falling?  No   Is the patient reluctant to leave their home because of a fear of falling?  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   Leisure --  yoga and walking for exercise   Posture/Postural Control   Posture Comments fwd head, rounded shoulders, significant winging on the right shoulder blade   Palpation   Palpation She is very tight in the right upper trap, cervical p-spinals and into the right rhomboid   Special Tests    Special Tests Cervical   Distraction Test   Comment less pain iwth distraction                          PT Education - 07/26/14 1533    Education provided Yes   Education Details scapular stabilization with red t-band and cervical retraction   Methods Explanation;Demonstration;Handout;Verbal cues  Comprehension Verbalized understanding;Returned demonstration;Tactile cues required;Verbal cues required          PT Short Term Goals - 07/26/14 1506    PT SHORT TERM GOAL #1   Title independent with initial HEP   Time 4   Period Weeks   Status New           PT Long Term Goals - 07/26/14 1534    PT LONG TERM GOAL #1   Title independent with advanced HEP   Time 8   Period Weeks   Status New   PT LONG TERM GOAL #2   Title independent with proper posture and body mechanics   Time 8   Period Weeks   Status New   PT LONG TERM GOAL #3   Title decrease pain 50%   Time 8   Period Weeks   Status New   PT LONG TERM GOAL #4   Title sit with proper posture x 10 minutes   Time 8   Period Weeks   Status New               Plan - 07/26/14 1502    Clinical Impression Statement Patient stepped in a pothole and fell, hurt her right arm, she  started having significant pain in the right shoulder and neck, MRI showed bonespurs on C5-6, she had a negative NCV test years ago   Pt will benefit from skilled therapeutic intervention in order to improve on the following deficits Decreased range of motion;Increased muscle spasms;Impaired flexibility;Impaired UE functional use;Postural dysfunction   Rehab Potential Good   PT Frequency 2x / week   PT Duration 8 weeks   PT Treatment/Interventions ADLs/Self Care Home Management;Electrical Stimulation;Ultrasound;Moist Heat;Therapeutic exercise;Therapeutic activities;Neuromuscular re-education;Patient/family education;Passive range of motion   PT Next Visit Plan add traction   PT Home Exercise Plan stretches   Consulted and Agree with Plan of Care Patient         Problem List Patient Active Problem List   Diagnosis Date Noted  . 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 ANEMIA-NOS 07/25/2010  . OTITIS MEDIA, SEROUS, LEFT 07/25/2010  . ALLERGIC RHINITIS 07/25/2010  . TEMPOROMANDIBULAR JOINT DISORDER 07/25/2010    Jearld LeschALBRIGHT,MICHAEL W, PT 07/26/2014, 3:41 PM  [facility] [address] [place], [place], [address] Phone: [phone]   Fax:  [phone]

## 2014-07-26 NOTE — Patient Instructions (Signed)
Chest Fly   Lie on back with knees bent, arms extended to sides and slightly bent, palms facing up. Inhale, then exhale while bringing weights together, arms straight. Hold 1 count. Slowly return to starting position. Repeat ____ times per set. Do ____ sets per session. Do ____ sessions per week. May be done with dumbbells, tubing or resistive band.  Copyright  VHI. All rights reserved.      PNF Strengthening: Resisted   .

## 2014-08-01 ENCOUNTER — Ambulatory Visit: Payer: PRIVATE HEALTH INSURANCE | Admitting: Physical Therapy

## 2014-08-01 ENCOUNTER — Encounter: Payer: Self-pay | Admitting: Physical Therapy

## 2014-08-01 DIAGNOSIS — M542 Cervicalgia: Secondary | ICD-10-CM

## 2014-08-01 DIAGNOSIS — M5030 Other cervical disc degeneration, unspecified cervical region: Secondary | ICD-10-CM | POA: Diagnosis not present

## 2014-08-01 DIAGNOSIS — M25511 Pain in right shoulder: Secondary | ICD-10-CM | POA: Diagnosis not present

## 2014-08-01 NOTE — Therapy (Signed)
[facility] [address] [place], [place], [address] Phone: [phone]   Fax:  [phone]  Physical Therapy Treatment  Patient Details  Name: Cheyenne White MRN: [id number] Date of Birth: 02/26/1961 Referring Provider:  Georgianne Fick, MD  Encounter Date: 08/01/2014      PT End of Session - 08/01/14 1403    Visit Number 2   Number of Visits 16   Date for PT Re-Evaluation 09/24/14   PT Start Time 1300   PT Stop Time 1417   PT Time Calculation (min) 77 min   Activity Tolerance Patient tolerated treatment well   Behavior During Therapy [facility] for tasks assessed/performed      Past Medical History  Diagnosis Date  . Thyroid nodule   . Menopause     Past Surgical History  Procedure Laterality Date  . Colonoscopy  11/14/2010    LMP 09/15/2010  Visit Diagnosis:  Neck pain      Subjective Assessment - 08/01/14 1304    Symptoms Doing pretty good.  Feels "pinching" in left rhomboid with HEP.   Currently in Pain? Yes   Pain Score 1    Pain Location Neck   Pain Orientation Right   Pain Type Chronic pain   Multiple Pain Sites No                    [facility] Adult PT Treatment/Exercise - 08/01/14 0001    Posture/Postural Control   Posture/Postural Control Postural limitations   Postural Limitations Forward head   Posture Comments fwd head, rounded shoulders, significant winging on the right shoulder blade   Exercises   Exercises Shoulder;Neck   Neck Exercises: Machines for Strengthening   UBE (Upper Arm Bike) 6 minutes  66fwd/3bk   Cybex Row 15#  2x15   Other Machines for Strengthening lat pull  15# 2x15   Shoulder Exercises: Seated   Row 15 reps   Row Weight (lbs) 15   Other Seated Exercises lat pull 15#  2x15   Shoulder Exercises: Standing   Extension Weight (lbs) 5   Other Standing Exercises overhead press with wt touch  3# 2x10   Other Standing Exercises chest to overhead  yellow  ball 2x10   Shoulder Exercises: Pulleys   Other Pulley Exercises extension  5# 2x15   Shoulder Exercises: ROM/Strengthening   Other ROM/Strengthening Exercises 3# superman  2x10   Shoulder Exercises: Stretch   Corner Stretch 3 reps;20 seconds   Other Shoulder Stretches postural reset at wall   Modalities   Modalities Traction;Moist Heat;Electrical Stimulation   Moist Heat Therapy   Number Minutes Moist Heat 15 Minutes   Moist Heat Location Other (comment)  cervical   Electrical Stimulation   Electrical Stimulation Location cerv/rhomboid   Electrical Stimulation Parameters IFC   Electrical Stimulation Goals Pain   Traction   Type of Traction Cervical   Min (lbs) 12   Max (lbs) 12   Hold Time static   Time 10 minutes                PT Education - 08/01/14 1405    Education provided Yes   Education Details Educated on posture, lifting, body mechanics   Person(s) Educated Patient   Methods Explanation;Demonstration   Comprehension Verbalized understanding          PT Short Term Goals - 07/26/14 1506    PT SHORT TERM GOAL #1   Title independent with initial HEP  Time 4   Period Weeks   Status New           PT Long Term Goals - 08/01/14 1353    PT LONG TERM GOAL #1   Title independent with advanced HEP   Time 8   Status New   PT LONG TERM GOAL #2   Title independent with proper posture and body mechanics   Time 8   Period Weeks   Status Achieved   PT LONG TERM GOAL #3   Title decrease pain 50%   Period Weeks   Status Achieved   PT LONG TERM GOAL #4   Time 8   Period Weeks   Status New               Plan - 08/01/14 1349    Clinical Impression Statement Patient presents with forward head and rounded shoulders.  Limited ROM and decreased strength.   Pt will benefit from skilled therapeutic intervention in order to improve on the following deficits Decreased range of motion;Increased muscle spasms;Impaired flexibility;Impaired UE  functional use;Postural dysfunction   Rehab Potential Good   PT Frequency 2x / week   PT Duration 8 weeks   PT Treatment/Interventions ADLs/Self Care Home Management;Electrical Stimulation;Ultrasound;Moist Heat;Therapeutic exercise;Therapeutic activities;Neuromuscular re-education;Patient/family education;Passive range of motion;Traction   PT Next Visit Plan Continue to strengthen upper extremities and neck to stabilize and decrease pain.   PT Home Exercise Plan stretches, exercises added today for stability.   Consulted and Agree with Plan of Care Patient        Problem List Patient Active Problem List   Diagnosis Date Noted  . 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 ANEMIA-NOS 07/25/2010  . OTITIS MEDIA, SEROUS, LEFT 07/25/2010  . ALLERGIC RHINITIS 07/25/2010  . TEMPOROMANDIBULAR JOINT DISORDER 07/25/2010    Malvin JohnsELKINS,China Deitrick  PTA 08/01/2014, 2:32 PM  [facility] [address] [place], [place], [address] Phone: [phone]   Fax:  [phone]

## 2014-08-08 ENCOUNTER — Encounter: Payer: Self-pay | Admitting: Physical Therapy

## 2014-08-08 ENCOUNTER — Ambulatory Visit: Payer: PRIVATE HEALTH INSURANCE | Attending: Internal Medicine | Admitting: Physical Therapy

## 2014-08-08 DIAGNOSIS — M25511 Pain in right shoulder: Secondary | ICD-10-CM | POA: Diagnosis not present

## 2014-08-08 DIAGNOSIS — M5030 Other cervical disc degeneration, unspecified cervical region: Secondary | ICD-10-CM | POA: Insufficient documentation

## 2014-08-08 DIAGNOSIS — M542 Cervicalgia: Secondary | ICD-10-CM | POA: Diagnosis present

## 2014-08-08 NOTE — Therapy (Signed)
[facility] [place] [facility] [address] [place], [place], [address] Phone: [phone]   Fax:  [phone]  Physical Therapy Treatment  Patient Details  Name: Cheyenne White MRN: [id number] Date of Birth: [birth year] Referring Provider:  Georgianne Fick, MD  Encounter Date: 08/08/2014      PT End of Session - 08/08/14 1514    Visit Number 3   Number of Visits 16   Date for PT Re-Evaluation 09/24/14   PT Start Time 1357   PT Stop Time 1500   PT Time Calculation (min) 63 min   Activity Tolerance Patient tolerated treatment well   Behavior During Therapy [facility] for tasks assessed/performed      Past Medical History  Diagnosis Date  . Thyroid nodule   . Menopause     Past Surgical History  Procedure Laterality Date  . Colonoscopy  11/14/2010    LMP 09/15/2010  Visit Diagnosis:  Neck pain  Pain in joint, shoulder region, right      Subjective Assessment - 08/08/14 1358    Symptoms really feeling better, but a stressful day today and I feel the tightness.   Pertinent History 7 years of neck pain she is right handed   Limitations Reading;Sitting;House hold activities   Patient Stated Goals no pain in the neck and shoulder   Currently in Pain? Yes   Pain Score 2    Pain Location Neck   Pain Orientation Right   Pain Descriptors / Indicators Aching;Spasm   Pain Type Chronic pain   Pain Onset Other (comment)   Pain Frequency Constant   Aggravating Factors  stress   Pain Relieving Factors heat helps                    [facility] Adult PT Treatment/Exercise - 08/08/14 0001    Neck Exercises: Machines for Strengthening   UBE (Upper Arm Bike) Level 5 x 6 minutes   Cybex Row 15# 2x15   Other Machines for Strengthening Lats 20#   Other Machines for Strengthening 4# shrugs with upper trap and levator stretches   Shoulder Exercises: ROM/Strengthening   "W" Arms 2#   X to V Arms 2#   Other ROM/Strengthening  Exercises 2# superman   Shoulder Exercises: Stretch   Corner Stretch 3 reps;10 seconds   Moist Heat Therapy   Number Minutes Moist Heat 15 Minutes   Moist Heat Location Other (comment)   Electrical Stimulation   Electrical Stimulation Location cervical and rhomboid area   Electrical Stimulation Parameters IFC   Electrical Stimulation Goals Pain   Manual Therapy   Manual Therapy Myofascial release   Myofascial Release to rhomboid and upper trap                  PT Short Term Goals - 07/26/14 1506    PT SHORT TERM GOAL #1   Title independent with initial HEP   Time 4   Period Weeks   Status New           PT Long Term Goals - 08/01/14 1353    PT LONG TERM GOAL #1   Title independent with advanced HEP   Time 8   Status New   PT LONG TERM GOAL #2   Title independent with proper posture and body mechanics   Time 8   Period Weeks   Status Achieved   PT LONG TERM GOAL #3   Title decrease pain 50%   Period  Weeks   Status Achieved   PT LONG TERM GOAL #4   Time 8   Period Weeks   Status New               Plan - 08/08/14 1515    Clinical Impression Statement Has some significant knots and spasms in the upper traps and in the rhomboid area   Pt will benefit from skilled therapeutic intervention in order to improve on the following deficits Decreased range of motion;Increased muscle spasms;Impaired flexibility;Impaired UE functional use;Postural dysfunction   Rehab Potential Good   PT Frequency 2x / week   PT Duration 8 weeks   PT Treatment/Interventions ADLs/Self Care Home Management;Electrical Stimulation;Ultrasound;Moist Heat;Therapeutic exercise;Therapeutic activities;Neuromuscular re-education;Patient/family education;Passive range of motion;Traction   PT Next Visit Plan Continue to strengthen upper extremities and neck to stabilize and decrease pain.   PT Home Exercise Plan stretches, exercises added today for stability.   Consulted and Agree with  Plan of Care Patient        Problem List Patient Active Problem List   Diagnosis Date Noted  . 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 ANEMIA-NOS 07/25/2010  . OTITIS MEDIA, SEROUS, LEFT 07/25/2010  . ALLERGIC RHINITIS 07/25/2010  . TEMPOROMANDIBULAR JOINT DISORDER 07/25/2010    Jearld LeschALBRIGHT,Zamaria Brazzle W, PT 08/08/2014, 3:17 PM  [facility] [address] [place], [place], [address] Phone: [phone]   Fax:  [phone]

## 2014-08-16 ENCOUNTER — Ambulatory Visit: Payer: PRIVATE HEALTH INSURANCE | Admitting: Physical Therapy

## 2014-08-17 ENCOUNTER — Encounter: Payer: Self-pay | Admitting: Physical Therapy

## 2014-08-20 ENCOUNTER — Ambulatory Visit: Payer: PRIVATE HEALTH INSURANCE | Admitting: Physical Therapy

## 2014-08-20 ENCOUNTER — Encounter: Payer: Self-pay | Admitting: Physical Therapy

## 2014-08-20 DIAGNOSIS — M25511 Pain in right shoulder: Secondary | ICD-10-CM

## 2014-08-20 DIAGNOSIS — M542 Cervicalgia: Secondary | ICD-10-CM | POA: Diagnosis not present

## 2014-08-20 NOTE — Therapy (Signed)
[facility] [address] [place], [place], [address] Phone: [phone]   Fax:  [phone]  Physical Therapy Treatment  Patient Details  Name: Cheyenne PoissonLinda M White MRN: [id number] Date of Birth: [birth year] Referring Provider:  Georgianne Fickamachandran, Ajith, MD  Encounter Date: 08/20/2014      PT End of Session - 08/20/14 1429    Visit Number 4   Number of Visits 16   Date for PT Re-Evaluation 09/24/14   PT Start Time 1358   PT Stop Time 1500   PT Time Calculation (min) 62 min   Activity Tolerance Patient tolerated treatment well      Past Medical History  Diagnosis Date  . Thyroid nodule   . Menopause     Past Surgical History  Procedure Laterality Date  . Colonoscopy  11/14/2010    There were no vitals filed for this visit.  Visit Diagnosis:  Neck pain  Pain in joint, shoulder region, right      Subjective Assessment - 08/20/14 1424    Symptoms Reports she has had some increased pain in the right upper trap and some numbness into the right hand.  She did describe doing a few gym exercises, but none that we wanted her to focus on   Pertinent History 7 years of neck pain she is right handed   Limitations Reading;Sitting;House hold activities   Patient Stated Goals no pain in the neck and shoulder   Currently in Pain? Yes   Pain Score 4    Pain Location Neck   Pain Orientation Right   Pain Descriptors / Indicators Aching;Spasm   Pain Type Chronic pain   Pain Radiating Towards right upper arm   Pain Onset Other (comment)   Pain Frequency Constant   Aggravating Factors  stress and sitting   Pain Relieving Factors heat, stretching                       [facility] Adult PT Treatment/Exercise - 08/20/14 0001    Neck Exercises: Machines for Strengthening   Other Machines for Strengthening 4# shrugs with upper trap and levator stretches   Shoulder Exercises: Stretch   Other Shoulder Stretches postural  reset at wall   Modalities   Modalities Traction;Moist Heat;Electrical Stimulation   Moist Heat Therapy   Number Minutes Moist Heat 15 Minutes   Moist Heat Location Other (comment)   Electrical Stimulation   Electrical Stimulation Location cervical and rhomboid area   Electrical Stimulation Parameters IFC   Electrical Stimulation Goals Pain   Traction   Type of Traction Cervical   Min (lbs) 12   Max (lbs) 12   Hold Time static   Time 12 minutes   Manual Therapy   Manual Therapy Myofascial release  also used the ice probe   Myofascial Release to rhomboid and upper trap                PT Education - 08/20/14 1429    Education provided Yes   Education Details went over what to do at gym and to do her trap stretches, gave green t-band   Person(s) Educated Patient   Methods Explanation;Demonstration   Comprehension Verbalized understanding          PT Short Term Goals - 07/26/14 1506    PT SHORT TERM GOAL #1   Title independent with initial HEP   Time 4   Period Weeks   Status New  PT Long Term Goals - 08/01/14 1353    PT LONG TERM GOAL #1   Title independent with advanced HEP   Time 8   Status New   PT LONG TERM GOAL #2   Title independent with proper posture and body mechanics   Time 8   Period Weeks   Status Achieved   PT LONG TERM GOAL #3   Title decrease pain 50%   Period Weeks   Status Achieved   PT LONG TERM GOAL #4   Time 8   Period Weeks   Status New               Plan - 08/20/14 1430    Clinical Impression Statement Reported less tightness after the STM and the stretches, continues to have a knot in the right upper trap area   Pt will benefit from skilled therapeutic intervention in order to improve on the following deficits Decreased range of motion;Increased muscle spasms;Impaired flexibility;Impaired UE functional use;Postural dysfunction   Rehab Potential Good   PT Frequency 2x / week   PT Duration 6 weeks   PT  Treatment/Interventions ADLs/Self Care Home Management;Electrical Stimulation;Ultrasound;Moist Heat;Therapeutic exercise;Therapeutic activities;Neuromuscular re-education;Patient/family education;Passive range of motion;Traction   PT Next Visit Plan Continue to strengthen upper extremities and neck to stabilize and decrease pain.   PT Home Exercise Plan stretches, exercises added today for stability.        Problem List Patient Active Problem List   Diagnosis Date Noted  . 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 ANEMIA-NOS 07/25/2010  . OTITIS MEDIA, SEROUS, LEFT 07/25/2010  . ALLERGIC RHINITIS 07/25/2010  . TEMPOROMANDIBULAR JOINT DISORDER 07/25/2010    Jearld Lesch, PT 08/20/2014, 2:32 PM  [facility] [place] [facility] [address] [place], [place], [address] Phone: [phone]   Fax:  [phone]

## 2014-08-27 ENCOUNTER — Ambulatory Visit: Payer: PRIVATE HEALTH INSURANCE | Admitting: Physical Therapy

## 2014-08-27 DIAGNOSIS — M25511 Pain in right shoulder: Secondary | ICD-10-CM

## 2014-08-27 DIAGNOSIS — M542 Cervicalgia: Secondary | ICD-10-CM | POA: Diagnosis not present

## 2014-08-27 NOTE — Therapy (Signed)
[facility] [address] [place], [place], [address] Phone: [phone]   Fax:  [phone]  Physical Therapy Treatment  Patient Details  Name: Cheyenne White MRN: [id number] Date of Birth: [birth year] Referring Provider:  Georgianne Fickamachandran, Ajith, MD  Encounter Date: 08/27/2014      PT End of Session - 08/27/14 1525    Visit Number 5   Number of Visits 16   Date for PT Re-Evaluation 09/24/14   PT Start Time 0241   PT Stop Time 0340   PT Time Calculation (min) 59 min      Past Medical History  Diagnosis Date  . Thyroid nodule   . Menopause     Past Surgical History  Procedure Laterality Date  . Colonoscopy  11/14/2010    There were no vitals filed for this visit.  Visit Diagnosis:  Pain in joint, shoulder region, right      Subjective Assessment - 08/27/14 1445    Symptoms worked with weights at [facility] with 10# on seated row and lat pulls. Massage therapist said she was extremly tight after that. feeling numbness in arm Rt down entire arm on Friday after workhot on Thursday  before massage therapist.    Currently in Pain? No/denies                       [facility]/Exercise - 08/27/14 0001    Neck Exercises: Machines for Strengthening   UBE (Upper Arm Bike) L5 X 6'   Shoulder Exercises: Supine   Other Supine Exercises lie over green noodle under scapulae, arms 90/90 for pecctorals stretch for 5 min.    Shoulder Exercises: Standing   Other Standing Exercises pect stretch using door jam and wall   Modalities   Modalities Electrical Stimulation;Moist Heat   Moist Heat Therapy   Number Minutes Moist Heat 15 Minutes   Moist Heat Location Shoulder   Electrical Stimulation   Electrical Stimulation Location rt shoudler   Manual Therapy   Manual Therapy Massage;Myofascial release   Massage pectorals R/L, L >R in tightness.   Myofascial Release pecotorals R/L, L tighter                 PT Education - 08/27/14 1508    Education provided Yes   Person(s) Educated Patient   Methods Explanation;Demonstration;Verbal cues;Tactile cues   Comprehension Verbalized understanding;Returned demonstration          PT Short Term Goals - 07/26/14 1506    PT SHORT TERM GOAL #1   Title independent with initial HEP   Time 4   Period Weeks   Status New           PT Long Term Goals - 08/01/14 1353    PT LONG TERM GOAL #1   Title independent with advanced HEP   Time 8   Status New   PT LONG TERM GOAL #2   Title independent with proper posture and body mechanics   Time 8   Period Weeks   Status Achieved   PT LONG TERM GOAL #3   Title decrease pain 50%   Period Weeks   Status Achieved   PT LONG TERM GOAL #4   Time 8   Period Weeks   Status New               Plan - 08/27/14 1526    Clinical Impression Statement patient did well with manual therapy  today. pects on left are extremely tight.    Rehab Potential Good   PT Frequency 2x / week   PT Duration 6 weeks   PT Treatment/Interventions ADLs/Self Care Home Management;Electrical Stimulation;Ultrasound;Moist Heat;Therapeutic exercise;Therapeutic activities;Neuromuscular re-education;Patient/family education;Passive range of motion;Traction   Consulted and Agree with Plan of Care Patient        Problem List Patient Active Problem List   Diagnosis Date Noted  . 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 ANEMIA-NOS 07/25/2010  . OTITIS MEDIA, SEROUS, LEFT 07/25/2010  . ALLERGIC RHINITIS 07/25/2010  . TEMPOROMANDIBULAR JOINT DISORDER 07/25/2010     Lady Deutscher, PTA  08/27/2014, 3:29 PM  [facility] [place] [facility] [address] [place], [place], [address] Phone: [phone]   Fax:  [phone]

## 2014-08-27 NOTE — Patient Instructions (Addendum)
Lie over noodle under scapulae with arms 90/90 to release pectorals and intercoastals. Fingers on door jam lean forward slightly to stretch pectorals

## 2014-08-30 ENCOUNTER — Ambulatory Visit: Payer: PRIVATE HEALTH INSURANCE | Admitting: Physical Therapy

## 2014-08-30 DIAGNOSIS — M25511 Pain in right shoulder: Secondary | ICD-10-CM

## 2014-08-30 DIAGNOSIS — M542 Cervicalgia: Secondary | ICD-10-CM | POA: Diagnosis not present

## 2014-08-30 NOTE — Therapy (Signed)
[facility] [address] [place], [place], [address] Phone: [phone]   Fax:  [phone]  Physical Therapy Treatment  Patient Details  Name: Cheyenne PoissonLinda M White MRN: [id number] Date of Birth: 1960/06/09 Referring Provider:  Georgianne Fickamachandran, Ajith, MD  Encounter Date: 08/30/2014      PT End of Session - 08/30/14 1512    Visit Number 6   PT Start Time 0244   PT Stop Time 0335   PT Time Calculation (min) 51 min   Activity Tolerance Patient tolerated treatment well   Behavior During Therapy [facility] for tasks assessed/performed      Past Medical History  Diagnosis Date  . Thyroid nodule   . Menopause     Past Surgical History  Procedure Laterality Date  . Colonoscopy  11/14/2010    There were no vitals filed for this visit.  Visit Diagnosis:  Pain in joint, shoulder region, right      Subjective Assessment - 08/30/14 1442    Symptoms pt worked on new HEP and feels better. a lttle more open front upper body.    Pertinent History 7 years of neck pain she is right handed   Limitations Reading;Sitting;House hold activities   Currently in Pain? Yes   Pain Score 1    Pain Location Shoulder   Pain Orientation Right   Pain Descriptors / Indicators Aching;Burning                       [facility] Adult PT Treatment/Exercise - 08/30/14 0001    Balance Poses: Yoga   Warrior I 2 reps;30 seconds  with pushing into strap for stabilization   Warrior II 1 rep;45 seconds  down dog with pushing out into strap   Neck Exercises: Machines for Strengthening   UBE (Upper Arm Bike) L5 X 6'   Other Machines for Strengthening lats X 15# 3X 10   Shoulder Exercises: Supine   Other Supine Exercises review over roll, then reach arms OH with pressing into belt to activate stabilizers mutliple times   Modalities   Modalities Cryotherapy   Cryotherapy   Number Minutes Cryotherapy 10 Minutes   Cryotherapy Location Shoulder   right   Type of Cryotherapy Ice pack                PT Education - 08/30/14 1518    Education provided Yes   Education Details will use strap for yoga poses at home to activate shoulder stabilizers   Person(s) Educated Patient   Methods Explanation;Demonstration;Verbal cues   Comprehension Verbalized understanding;Returned demonstration          PT Short Term Goals - 07/26/14 1506    PT SHORT TERM GOAL #1   Title independent with initial HEP   Time 4   Period Weeks   Status New           PT Long Term Goals - 08/01/14 1353    PT LONG TERM GOAL #1   Title independent with advanced HEP   Time 8   Status New   PT LONG TERM GOAL #2   Title independent with proper posture and body mechanics   Time 8   Period Weeks   Status Achieved   PT LONG TERM GOAL #3   Title decrease pain 50%   Period Weeks   Status Achieved   PT LONG TERM GOAL #4   Time 8   Period Weeks   Status New  Plan - 08/30/14 1518    Clinical Impression Statement pt was excited about feeling better and adding shoulder exercises to her yoga practice. she did very well performing each new task.   Pt will benefit from skilled therapeutic intervention in order to improve on the following deficits Decreased range of motion;Increased muscle spasms;Impaired flexibility;Impaired UE functional use;Postural dysfunction   Rehab Potential Good   PT Frequency 2x / week   PT Duration 6 weeks   PT Treatment/Interventions ADLs/Self Care Home Management;Electrical Stimulation;Ultrasound;Moist Heat;Therapeutic exercise;Therapeutic activities;Neuromuscular re-education;Patient/family education;Passive range of motion;Traction   PT Next Visit Plan Continue to strengthen upper extremities and neck to stabilize and decrease pain.   PT Home Exercise Plan  exercises added today for stability.        Problem List Patient Active Problem List   Diagnosis Date Noted  . 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  ANEMIA-NOS 07/25/2010  . OTITIS MEDIA, SEROUS, LEFT 07/25/2010  . ALLERGIC RHINITIS 07/25/2010  . TEMPOROMANDIBULAR JOINT DISORDER 07/25/2010     Lady Deutscher, PTA  08/30/2014, 3:22 PM  [facility] [address] [place], [place], [address] Phone: [phone]   Fax:  [phone]

## 2014-09-03 ENCOUNTER — Ambulatory Visit: Payer: PRIVATE HEALTH INSURANCE | Admitting: Physical Therapy

## 2014-09-06 ENCOUNTER — Encounter: Payer: Self-pay | Admitting: Physical Therapy

## 2014-09-06 ENCOUNTER — Ambulatory Visit: Payer: PRIVATE HEALTH INSURANCE | Admitting: Physical Therapy

## 2014-09-06 DIAGNOSIS — M542 Cervicalgia: Secondary | ICD-10-CM

## 2014-09-06 DIAGNOSIS — M25511 Pain in right shoulder: Secondary | ICD-10-CM

## 2014-09-06 NOTE — Therapy (Signed)
[facility] [address] [place], [place], [address] Phone: [phone]   Fax:  [phone]  Physical Therapy Treatment  Patient Details  Name: Cheyenne PoissonLinda M White MRN: [id number] Date of Birth: December 24, 1960 Referring Provider:  Georgianne Fickamachandran, Ajith, MD  Encounter Date: 09/06/2014      PT End of Session - 09/06/14 1547    Visit Number 7   Number of Visits 16   Date for PT Re-Evaluation 09/24/14   PT Start Time 1445   PT Stop Time 1548   PT Time Calculation (min) 63 min   Activity Tolerance Patient tolerated treatment well   Behavior During Therapy [facility] for tasks assessed/performed      Past Medical History  Diagnosis Date  . Thyroid nodule   . Menopause     Past Surgical History  Procedure Laterality Date  . Colonoscopy  11/14/2010    There were no vitals filed for this visit.  Visit Diagnosis:  No diagnosis found.      Subjective Assessment - 09/06/14 1444    Symptoms Yesterday was really bad.    Currently in Pain? Yes   Pain Score 2             [facility] PT Assessment - 09/06/14 0001    ROM / Strength   AROM / PROM / Strength Strength   Strength   Strength Assessment Site Shoulder   Right/Left Shoulder Right;Left   Right Shoulder Flexion 5/5   Right Shoulder ABduction 5/5   Right Shoulder Internal Rotation 5/5   Right Shoulder External Rotation 4/5   Left Shoulder Flexion 5/5   Left Shoulder ABduction 5/5   Left Shoulder Internal Rotation 5/5   Left Shoulder External Rotation 4/5                   [facility] Adult PT Treatment/Exercise - 09/06/14 0001    Exercises   Exercises Shoulder;Neck   Neck Exercises: Machines for Strengthening   UBE (Upper Arm Bike) L5 X 6'   Neck Exercises: Theraband   Shoulder External Rotation 5 reps  red theraband   Neck Exercises: Standing   Upper Extremity D2 10 reps  2 sets   UE D2 Weights (lbs) 4   Neck Exercises: Supine   Other Supine Exercise  retraction   Neck Exercises: Prone   Other Prone Exercise protraction   Shoulder Exercises: Standing   Other Standing Exercises overhead press with touch 4#  2x10   Shoulder Exercises: Stretch   Corner Stretch 5 reps;10 seconds   Modalities   Modalities Electrical Stimulation;Moist Heat   Manual Therapy   Manual Therapy Myofascial release   Myofascial Release traps into rhomboids                  PT Short Term Goals - 09/06/14 1451    PT SHORT TERM GOAL #1   Title independent with initial HEP   Time 4   Period Weeks   Status Achieved           PT Long Term Goals - 09/06/14 1452    PT LONG TERM GOAL #1   Title independent with advanced HEP   Time 8   Period Weeks   Status On-going   PT LONG TERM GOAL #2   Title independent with proper posture and body mechanics   Time 8   Period Weeks   Status Achieved   PT LONG TERM GOAL #3   Title decrease  pain 50%   Time 8   Period Weeks   Status Achieved   PT LONG TERM GOAL #4   Title sit with proper posture x 10 minutes   Time 8   Period Weeks   Status On-going               Plan - 09/06/14 1555    Clinical Impression Statement Decreased strength in external rotators.  Tight pectorals and anterior shoulder.   Pt will benefit from skilled therapeutic intervention in order to improve on the following deficits Decreased range of motion;Increased muscle spasms;Impaired flexibility;Impaired UE functional use;Postural dysfunction   Rehab Potential Good   PT Treatment/Interventions ADLs/Self Care Home Management;Electrical Stimulation;Ultrasound;Moist Heat;Therapeutic exercise;Therapeutic activities;Neuromuscular re-education;Patient/family education;Passive range of motion;Traction   PT Next Visit Plan Continue to strengthen upper extremities and neck to stabilize and decrease pain.   Consulted and Agree with Plan of Care Patient        Problem List Patient Active Problem List   Diagnosis Date Noted  .  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 ANEMIA-NOS 07/25/2010  . OTITIS MEDIA, SEROUS, LEFT 07/25/2010  . ALLERGIC RHINITIS 07/25/2010  . TEMPOROMANDIBULAR JOINT DISORDER 07/25/2010    Malvin Johns PTA 09/06/2014, 3:57 PM  [facility] [place] [facility] [address] [place], [place], [address] Phone: [phone]   Fax:  [phone]

## 2014-09-13 ENCOUNTER — Ambulatory Visit: Payer: PRIVATE HEALTH INSURANCE | Attending: Internal Medicine | Admitting: Physical Therapy

## 2014-09-13 ENCOUNTER — Encounter: Payer: Self-pay | Admitting: Physical Therapy

## 2014-09-13 DIAGNOSIS — M5030 Other cervical disc degeneration, unspecified cervical region: Secondary | ICD-10-CM | POA: Insufficient documentation

## 2014-09-13 DIAGNOSIS — M542 Cervicalgia: Secondary | ICD-10-CM | POA: Diagnosis not present

## 2014-09-13 DIAGNOSIS — M25511 Pain in right shoulder: Secondary | ICD-10-CM | POA: Diagnosis not present

## 2014-09-13 NOTE — Therapy (Signed)
[place], [place], [address] Phone: [phone]   Fax:  [phone]  Physical Therapy Treatment  Patient Details  Name: Cheyenne White MRN: [id number] Date of Birth: [birth year] Referring Provider:  Merrilee Seashore, MD  Encounter Date: 09/13/2014      PT End of Session - 09/13/14 1533    PT Start Time 1450   PT Stop Time 1531   PT Time Calculation (min) 41 min   Activity Tolerance Patient tolerated treatment well   Behavior During Therapy [facility] for tasks assessed/performed      Past Medical History  Diagnosis Date  . Thyroid nodule   . Menopause     Past Surgical History  Procedure Laterality Date  . Colonoscopy  11/14/2010    There were no vitals filed for this visit.  Visit Diagnosis:  Pain in joint, shoulder region, right  Neck pain      Subjective Assessment - 09/13/14 1459    Subjective Really good today.   Currently in Pain? Yes   Pain Score 1    Pain Location Shoulder   Pain Orientation Right                       [facility] Adult PT Treatment/Exercise - 09/13/14 0001    Exercises   Exercises Shoulder;Neck   Neck Exercises: Machines for Strengthening   UBE (Upper Arm Bike) Nustep level 5 arms only x 4 minutes   Neck Exercises: Theraband   Other Theraband Exercises overhead Y  red theraband 2x10   Other Theraband Exercises D2 flex PNF  2x10 green theraband   Shoulder Exercises: Seated   Row 15 reps  2 sets   Row Weight (lbs) 25   Other Seated Exercises lat pull 25#  2x15   Other Seated Exercises chest press 15#  2x10   Shoulder Exercises: Standing   Other Standing Exercises 4# pillowcases overhead to touch  2x15   Shoulder Exercises: Pulleys   Other Pulley Exercises horizontal abd  5# 2x10   Shoulder Exercises: ROM/Strengthening   UBE (Upper Arm Bike) 6 minutes                  PT Short Term Goals - 09/06/14 1451    PT SHORT  TERM GOAL #1   Title independent with initial HEP   Time 4   Period Weeks   Status Achieved           PT Long Term Goals - 09/13/14 1505    PT LONG TERM GOAL #1   Title independent with advanced HEP   Time 8   Period Weeks   Status Achieved   PT LONG TERM GOAL #2   Title independent with proper posture and body mechanics   Time 8   Period Weeks   Status Achieved   PT LONG TERM GOAL #3   Title decrease pain 50%   Time 8   Period Weeks   Status Achieved   PT LONG TERM GOAL #4   Title sit with proper posture x 10 minutes   Time 8   Period Weeks   Status Partially Met               Plan - 09/13/14 1506    Clinical Impression Statement Patient feels that she can continue independently.   PT Next Visit Plan discharge this visit.   Consulted and Agree with Plan of Care Patient  Problem List Patient Active Problem List   Diagnosis Date Noted  . 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 ANEMIA-NOS 07/25/2010  . OTITIS MEDIA, SEROUS, LEFT 07/25/2010  . ALLERGIC RHINITIS 07/25/2010  . TEMPOROMANDIBULAR JOINT DISORDER 07/25/2010    Nada Boozer PTA 09/13/2014, 3:35 PM  [place] [place] [place], [place], [address] Phone: [phone]   Fax:  [phone]

## 2014-09-20 NOTE — Therapy (Signed)
[place], [place], [address] Phone: [phone]   Fax:  [phone]  Physical Therapy Treatment  Patient Details  Name: Cheyenne White MRN: [id number] Date of Birth: 09-05-1960 Referring Provider:  Merrilee Seashore, MD  Encounter Date: 08/30/2014    Past Medical History  Diagnosis Date  . Thyroid nodule   . Menopause     Past Surgical History  Procedure Laterality Date  . Colonoscopy  11/14/2010    There were no vitals filed for this visit.  Visit Diagnosis:  Pain in joint, shoulder region, right                               PT Short Term Goals - 09/06/14 1451    PT SHORT TERM GOAL #1   Title independent with initial HEP   Time 4   Period Weeks   Status Achieved           PT Long Term Goals - 09/13/14 1505    PT LONG TERM GOAL #1   Title independent with advanced HEP   Time 8   Period Weeks   Status Achieved   PT LONG TERM GOAL #2   Title independent with proper posture and body mechanics   Time 8   Period Weeks   Status Achieved   PT LONG TERM GOAL #3   Title decrease pain 50%   Time 8   Period Weeks   Status Achieved   PT LONG TERM GOAL #4   Title sit with proper posture x 10 minutes   Time 8   Period Weeks   Status Partially Met               Problem List Patient Active Problem List   Diagnosis Date Noted  . 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 ANEMIA-NOS 07/25/2010  . OTITIS MEDIA, SEROUS, LEFT 07/25/2010  . ALLERGIC RHINITIS 07/25/2010  . TEMPOROMANDIBULAR JOINT DISORDER 07/25/2010    Natividad Brood 09/20/2014, 1:04 PM  [place] [address] [place], [place], [address] Phone: [phone]   Fax:  [phone]

## 2014-09-20 NOTE — Therapy (Signed)
[facility] [address] [place], [place], [address] Phone: [phone]   Fax:  [phone]  Patient Details  Name: Cheyenne PoissonLinda M White MRN: [id number] Date of Birth: 08-Jun-1961 Referring Provider:  Georgianne Fickamachandran, Ajith, MD  Encounter Date: 08/30/2014   Cheyenne White, Cheyenne White 09/20/2014, 1:05 PM  [facility] [address] [place], [place], [address] Phone: [phone]   Fax:  [phone]

## 2014-09-20 NOTE — Therapy (Signed)
[facility] [address] [place], [place], [address] Phone: [phone]   Fax:  [phone]  Patient Details  Name: Cheyenne PoissonLinda M Groom MRN: [id number] Date of Birth: [birth year] Referring Provider:  Georgianne Fickamachandran, Ajith, MD  Encounter Date: 08/30/2014   Lady DeutscherWenzel, Delyla Sandeen Parnell 09/20/2014, 1:03 PM  [facility] [address] [place], [place], [address] Phone: [phone]   Fax:  [phone]

## 2014-09-20 NOTE — Therapy (Signed)
[place] [address] [place], [place], [address] Phone: [phone]   Fax:  [phone]  Physical Therapy Treatment  Patient Details  Name: Cheyenne White MRN: [id number] Date of Birth: [birth year] Referring Provider:  Merrilee Seashore, MD  Encounter Date: 08/30/2014    Past Medical History  Diagnosis Date  . Thyroid nodule   . Menopause     Past Surgical History  Procedure Laterality Date  . Colonoscopy  11/14/2010    There were no vitals filed for this visit.  Visit Diagnosis:  Pain in joint, shoulder region, right                               PT Short Term Goals - 09/06/14 1451    PT SHORT TERM GOAL #1   Title independent with initial HEP   Time 4   Period Weeks   Status Achieved           PT Long Term Goals - 09/13/14 1505    PT LONG TERM GOAL #1   Title independent with advanced HEP   Time 8   Period Weeks   Status Achieved   PT LONG TERM GOAL #2   Title independent with proper posture and body mechanics   Time 8   Period Weeks   Status Achieved   PT LONG TERM GOAL #3   Title decrease pain 50%   Time 8   Period Weeks   Status Achieved   PT LONG TERM GOAL #4   Title sit with proper posture x 10 minutes   Time 8   Period Weeks   Status Partially Met               Problem List Patient Active Problem List   Diagnosis Date Noted  . 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 ANEMIA-NOS 07/25/2010  . OTITIS MEDIA, SEROUS, LEFT 07/25/2010  . ALLERGIC RHINITIS 07/25/2010  . TEMPOROMANDIBULAR JOINT DISORDER 07/25/2010    Natividad Brood 09/20/2014, 1:00 PM  [place] [address] [address] [place], [place], [address] Phone: [phone]   Fax:  [phone]

## 2016-04-27 DIAGNOSIS — D2262 Melanocytic nevi of left upper limb, including shoulder: Secondary | ICD-10-CM | POA: Diagnosis not present

## 2016-04-27 DIAGNOSIS — D485 Neoplasm of uncertain behavior of skin: Secondary | ICD-10-CM | POA: Diagnosis not present

## 2016-04-27 DIAGNOSIS — D2261 Melanocytic nevi of right upper limb, including shoulder: Secondary | ICD-10-CM | POA: Diagnosis not present

## 2016-04-27 DIAGNOSIS — D225 Melanocytic nevi of trunk: Secondary | ICD-10-CM | POA: Diagnosis not present

## 2016-06-09 DIAGNOSIS — E041 Nontoxic single thyroid nodule: Secondary | ICD-10-CM | POA: Diagnosis not present

## 2016-06-09 DIAGNOSIS — Z1321 Encounter for screening for nutritional disorder: Secondary | ICD-10-CM | POA: Diagnosis not present

## 2016-06-09 DIAGNOSIS — Z0000 Encounter for general adult medical examination without abnormal findings: Secondary | ICD-10-CM | POA: Diagnosis not present

## 2016-06-16 DIAGNOSIS — Z0000 Encounter for general adult medical examination without abnormal findings: Secondary | ICD-10-CM | POA: Diagnosis not present

## 2016-06-16 DIAGNOSIS — M5090 Cervical disc disorder, unspecified, unspecified cervical region: Secondary | ICD-10-CM | POA: Diagnosis not present

## 2016-10-05 DIAGNOSIS — K080 Exfoliation of teeth due to systemic causes: Secondary | ICD-10-CM | POA: Diagnosis not present

## 2016-12-21 DIAGNOSIS — Z124 Encounter for screening for malignant neoplasm of cervix: Secondary | ICD-10-CM | POA: Diagnosis not present

## 2016-12-21 DIAGNOSIS — Z1231 Encounter for screening mammogram for malignant neoplasm of breast: Secondary | ICD-10-CM | POA: Diagnosis not present

## 2016-12-21 DIAGNOSIS — Z01419 Encounter for gynecological examination (general) (routine) without abnormal findings: Secondary | ICD-10-CM | POA: Diagnosis not present

## 2016-12-21 DIAGNOSIS — Z6825 Body mass index (BMI) 25.0-25.9, adult: Secondary | ICD-10-CM | POA: Diagnosis not present

## 2017-04-07 DIAGNOSIS — K080 Exfoliation of teeth due to systemic causes: Secondary | ICD-10-CM | POA: Diagnosis not present

## 2017-04-27 DIAGNOSIS — Z85828 Personal history of other malignant neoplasm of skin: Secondary | ICD-10-CM | POA: Diagnosis not present

## 2017-04-27 DIAGNOSIS — D2261 Melanocytic nevi of right upper limb, including shoulder: Secondary | ICD-10-CM | POA: Diagnosis not present

## 2017-04-27 DIAGNOSIS — D2262 Melanocytic nevi of left upper limb, including shoulder: Secondary | ICD-10-CM | POA: Diagnosis not present

## 2017-04-27 DIAGNOSIS — D225 Melanocytic nevi of trunk: Secondary | ICD-10-CM | POA: Diagnosis not present

## 2017-05-03 DIAGNOSIS — B029 Zoster without complications: Secondary | ICD-10-CM | POA: Diagnosis not present

## 2017-06-07 DIAGNOSIS — J069 Acute upper respiratory infection, unspecified: Secondary | ICD-10-CM | POA: Diagnosis not present

## 2017-06-07 DIAGNOSIS — M2061 Acquired deformities of toe(s), unspecified, right foot: Secondary | ICD-10-CM | POA: Diagnosis not present

## 2017-06-11 ENCOUNTER — Encounter: Payer: Self-pay | Admitting: Podiatry

## 2017-06-11 ENCOUNTER — Ambulatory Visit: Payer: Federal, State, Local not specified - PPO | Admitting: Podiatry

## 2017-06-11 DIAGNOSIS — B351 Tinea unguium: Secondary | ICD-10-CM | POA: Diagnosis not present

## 2017-06-11 DIAGNOSIS — L600 Ingrowing nail: Secondary | ICD-10-CM

## 2017-06-11 NOTE — Progress Notes (Signed)
   Subjective:    Patient ID: Cheyenne PoissonLinda M Dunklee, female    DOB: 1961-05-24, 57 y.o.   MRN: [id number]  HPI    Review of Systems  All other systems reviewed and are negative.      Objective:   Physical Exam        Assessment & Plan:

## 2017-06-11 NOTE — Progress Notes (Signed)
Subjective:   Patient ID: Cheyenne White, female   DOB: 57 y.o.   MRN: [id number]   HPI Patient presents with a discolored right hallux nail over the last couple weeks with slight discoloration left.  Does not remember specific injury but has traumatized the nail in the past   Review of Systems  All other systems reviewed and are negative.       Objective:  Physical Exam  Constitutional: She appears well-developed and well-nourished.  Cardiovascular: Intact distal pulses.  Pulmonary/Chest: Effort normal.  Musculoskeletal: Normal range of motion.  Neurological: She is alert.  Skin: Skin is warm.  Nursing note and vitals reviewed.   Neurovascular status intact muscle strength adequate range of motion within normal limits with patient found to have discoloration of the hallux nail right distal two thirds and slight looseness of the nail with a small spot on the left.  There is good digital perfusion well oriented x3     Assessment:  Probability for trauma of the hallux nail right over left of the localized nature     Plan:  H&P condition reviewed and recommended continued soaks debridement end of the nail gets loose red or on the left she is complaining of ingrown toenails continue to persist then will get a need to go ahead and remove the ingrown toenails.  Possibly remove the right hallux nail.  Patient will be seen back to reevaluate

## 2017-07-06 DIAGNOSIS — E041 Nontoxic single thyroid nodule: Secondary | ICD-10-CM | POA: Diagnosis not present

## 2017-07-06 DIAGNOSIS — Z131 Encounter for screening for diabetes mellitus: Secondary | ICD-10-CM | POA: Diagnosis not present

## 2017-07-06 DIAGNOSIS — Z0000 Encounter for general adult medical examination without abnormal findings: Secondary | ICD-10-CM | POA: Diagnosis not present

## 2017-07-06 DIAGNOSIS — I10 Essential (primary) hypertension: Secondary | ICD-10-CM | POA: Diagnosis not present

## 2017-07-13 DIAGNOSIS — Z0000 Encounter for general adult medical examination without abnormal findings: Secondary | ICD-10-CM | POA: Diagnosis not present

## 2017-07-13 DIAGNOSIS — M5090 Cervical disc disorder, unspecified, unspecified cervical region: Secondary | ICD-10-CM | POA: Diagnosis not present

## 2017-11-08 DIAGNOSIS — K080 Exfoliation of teeth due to systemic causes: Secondary | ICD-10-CM | POA: Diagnosis not present

## 2017-11-12 DIAGNOSIS — Z6825 Body mass index (BMI) 25.0-25.9, adult: Secondary | ICD-10-CM | POA: Diagnosis not present

## 2017-11-12 DIAGNOSIS — N8110 Cystocele, unspecified: Secondary | ICD-10-CM | POA: Diagnosis not present

## 2017-12-02 DIAGNOSIS — R350 Frequency of micturition: Secondary | ICD-10-CM | POA: Diagnosis not present

## 2017-12-02 DIAGNOSIS — M6281 Muscle weakness (generalized): Secondary | ICD-10-CM | POA: Diagnosis not present

## 2017-12-02 DIAGNOSIS — N8110 Cystocele, unspecified: Secondary | ICD-10-CM | POA: Diagnosis not present

## 2017-12-16 DIAGNOSIS — R350 Frequency of micturition: Secondary | ICD-10-CM | POA: Diagnosis not present

## 2017-12-16 DIAGNOSIS — N8110 Cystocele, unspecified: Secondary | ICD-10-CM | POA: Diagnosis not present

## 2017-12-16 DIAGNOSIS — M6281 Muscle weakness (generalized): Secondary | ICD-10-CM | POA: Diagnosis not present

## 2017-12-23 DIAGNOSIS — R350 Frequency of micturition: Secondary | ICD-10-CM | POA: Diagnosis not present

## 2017-12-23 DIAGNOSIS — N8110 Cystocele, unspecified: Secondary | ICD-10-CM | POA: Diagnosis not present

## 2017-12-23 DIAGNOSIS — M6281 Muscle weakness (generalized): Secondary | ICD-10-CM | POA: Diagnosis not present

## 2017-12-30 DIAGNOSIS — R350 Frequency of micturition: Secondary | ICD-10-CM | POA: Diagnosis not present

## 2017-12-30 DIAGNOSIS — M6281 Muscle weakness (generalized): Secondary | ICD-10-CM | POA: Diagnosis not present

## 2017-12-30 DIAGNOSIS — N8110 Cystocele, unspecified: Secondary | ICD-10-CM | POA: Diagnosis not present

## 2018-01-05 DIAGNOSIS — Z124 Encounter for screening for malignant neoplasm of cervix: Secondary | ICD-10-CM | POA: Diagnosis not present

## 2018-01-05 DIAGNOSIS — Z01419 Encounter for gynecological examination (general) (routine) without abnormal findings: Secondary | ICD-10-CM | POA: Diagnosis not present

## 2018-01-05 DIAGNOSIS — Z6825 Body mass index (BMI) 25.0-25.9, adult: Secondary | ICD-10-CM | POA: Diagnosis not present

## 2018-01-05 DIAGNOSIS — Z1231 Encounter for screening mammogram for malignant neoplasm of breast: Secondary | ICD-10-CM | POA: Diagnosis not present

## 2018-01-13 DIAGNOSIS — N8110 Cystocele, unspecified: Secondary | ICD-10-CM | POA: Diagnosis not present

## 2018-01-13 DIAGNOSIS — M6281 Muscle weakness (generalized): Secondary | ICD-10-CM | POA: Diagnosis not present

## 2018-01-13 DIAGNOSIS — R350 Frequency of micturition: Secondary | ICD-10-CM | POA: Diagnosis not present

## 2018-01-20 DIAGNOSIS — R350 Frequency of micturition: Secondary | ICD-10-CM | POA: Diagnosis not present

## 2018-01-20 DIAGNOSIS — N8110 Cystocele, unspecified: Secondary | ICD-10-CM | POA: Diagnosis not present

## 2018-01-20 DIAGNOSIS — M6281 Muscle weakness (generalized): Secondary | ICD-10-CM | POA: Diagnosis not present

## 2018-01-27 DIAGNOSIS — M6281 Muscle weakness (generalized): Secondary | ICD-10-CM | POA: Diagnosis not present

## 2018-01-27 DIAGNOSIS — R350 Frequency of micturition: Secondary | ICD-10-CM | POA: Diagnosis not present

## 2018-01-27 DIAGNOSIS — N8110 Cystocele, unspecified: Secondary | ICD-10-CM | POA: Diagnosis not present

## 2018-04-28 DIAGNOSIS — D2262 Melanocytic nevi of left upper limb, including shoulder: Secondary | ICD-10-CM | POA: Diagnosis not present

## 2018-04-28 DIAGNOSIS — D224 Melanocytic nevi of scalp and neck: Secondary | ICD-10-CM | POA: Diagnosis not present

## 2018-04-28 DIAGNOSIS — D2261 Melanocytic nevi of right upper limb, including shoulder: Secondary | ICD-10-CM | POA: Diagnosis not present

## 2018-04-28 DIAGNOSIS — D225 Melanocytic nevi of trunk: Secondary | ICD-10-CM | POA: Diagnosis not present

## 2018-05-12 DIAGNOSIS — K080 Exfoliation of teeth due to systemic causes: Secondary | ICD-10-CM | POA: Diagnosis not present

## 2018-06-29 DIAGNOSIS — K080 Exfoliation of teeth due to systemic causes: Secondary | ICD-10-CM | POA: Diagnosis not present

## 2018-07-26 DIAGNOSIS — E041 Nontoxic single thyroid nodule: Secondary | ICD-10-CM | POA: Diagnosis not present

## 2018-07-26 DIAGNOSIS — Z131 Encounter for screening for diabetes mellitus: Secondary | ICD-10-CM | POA: Diagnosis not present

## 2018-07-26 DIAGNOSIS — Z0000 Encounter for general adult medical examination without abnormal findings: Secondary | ICD-10-CM | POA: Diagnosis not present

## 2018-07-26 DIAGNOSIS — I10 Essential (primary) hypertension: Secondary | ICD-10-CM | POA: Diagnosis not present

## 2018-08-05 DIAGNOSIS — H43813 Vitreous degeneration, bilateral: Secondary | ICD-10-CM | POA: Diagnosis not present

## 2018-08-08 DIAGNOSIS — M5090 Cervical disc disorder, unspecified, unspecified cervical region: Secondary | ICD-10-CM | POA: Diagnosis not present

## 2018-08-08 DIAGNOSIS — Z0000 Encounter for general adult medical examination without abnormal findings: Secondary | ICD-10-CM | POA: Diagnosis not present

## 2018-08-15 DIAGNOSIS — L732 Hidradenitis suppurativa: Secondary | ICD-10-CM | POA: Diagnosis not present

## 2018-10-21 DIAGNOSIS — H15111 Episcleritis periodica fugax, right eye: Secondary | ICD-10-CM | POA: Diagnosis not present

## 2019-01-13 DIAGNOSIS — Z6824 Body mass index (BMI) 24.0-24.9, adult: Secondary | ICD-10-CM | POA: Diagnosis not present

## 2019-01-13 DIAGNOSIS — Z01419 Encounter for gynecological examination (general) (routine) without abnormal findings: Secondary | ICD-10-CM | POA: Diagnosis not present

## 2019-01-13 DIAGNOSIS — Z1231 Encounter for screening mammogram for malignant neoplasm of breast: Secondary | ICD-10-CM | POA: Diagnosis not present

## 2019-05-01 DIAGNOSIS — D225 Melanocytic nevi of trunk: Secondary | ICD-10-CM | POA: Diagnosis not present

## 2019-05-01 DIAGNOSIS — D2262 Melanocytic nevi of left upper limb, including shoulder: Secondary | ICD-10-CM | POA: Diagnosis not present

## 2019-05-01 DIAGNOSIS — D2271 Melanocytic nevi of right lower limb, including hip: Secondary | ICD-10-CM | POA: Diagnosis not present

## 2019-05-01 DIAGNOSIS — L720 Epidermal cyst: Secondary | ICD-10-CM | POA: Diagnosis not present

## 2019-05-01 DIAGNOSIS — Z85828 Personal history of other malignant neoplasm of skin: Secondary | ICD-10-CM | POA: Diagnosis not present

## 2019-07-05 DIAGNOSIS — H35353 Cystoid macular degeneration, bilateral: Secondary | ICD-10-CM | POA: Diagnosis not present

## 2019-08-17 DIAGNOSIS — H35351 Cystoid macular degeneration, right eye: Secondary | ICD-10-CM | POA: Diagnosis not present

## 2019-09-14 ENCOUNTER — Ambulatory Visit: Payer: PRIVATE HEALTH INSURANCE | Attending: Internal Medicine

## 2019-09-14 DIAGNOSIS — Z23 Encounter for immunization: Secondary | ICD-10-CM

## 2019-09-14 NOTE — Progress Notes (Signed)
   Covid-19 Vaccination Clinic  Name:  Cheyenne White    MRN: [id number] DOB: [birth year]  09/14/2019  [name] was observed post Covid-19 immunization for 15 minutes without incident. She was provided with Vaccine Information Sheet and instruction to access the V-Safe system.   [name] was instructed to call 911 with any severe reactions post vaccine: Marland Kitchen Difficulty breathing  . Swelling of face and throat  . A fast heartbeat  . A bad rash all over body  . Dizziness and weakness   Immunizations Administered    Name Date Dose VIS Date Route   Pfizer COVID-19 Vaccine 09/14/2019  8:37 AM 0.3 mL 05/19/2019 Intramuscular   Manufacturer: [employer], [employer]   Lot: [id number]   NDC: [address]-7

## 2019-10-03 DIAGNOSIS — Z131 Encounter for screening for diabetes mellitus: Secondary | ICD-10-CM | POA: Diagnosis not present

## 2019-10-03 DIAGNOSIS — Z0000 Encounter for general adult medical examination without abnormal findings: Secondary | ICD-10-CM | POA: Diagnosis not present

## 2019-10-03 DIAGNOSIS — Z13220 Encounter for screening for lipoid disorders: Secondary | ICD-10-CM | POA: Diagnosis not present

## 2019-10-09 ENCOUNTER — Ambulatory Visit: Payer: PRIVATE HEALTH INSURANCE | Attending: Internal Medicine

## 2019-10-09 DIAGNOSIS — Z23 Encounter for immunization: Secondary | ICD-10-CM

## 2019-10-09 NOTE — Progress Notes (Signed)
   Covid-19 Vaccination Clinic  Name:  Cheyenne White    MRN: [id number] DOB: 04-Sep-1960  10/09/2019  Ms. Cheyenne White was observed post Covid-19 immunization for 15 minutes without incident. She was provided with Vaccine Information Sheet and instruction to access the V-Safe system.   Ms. Cheyenne White was instructed to call 911 with any severe reactions post vaccine: Marland Kitchen Difficulty breathing  . Swelling of face and throat  . A fast heartbeat  . A bad rash all over body  . Dizziness and weakness   Immunizations Administered    Name Date Dose VIS Date Route   Pfizer COVID-19 Vaccine 10/09/2019  8:25 AM 0.3 mL 08/02/2018 Intramuscular   Manufacturer: [employer], [employer]   Lot: [id number]   NDC: [address]-7

## 2019-10-10 DIAGNOSIS — M5090 Cervical disc disorder, unspecified, unspecified cervical region: Secondary | ICD-10-CM | POA: Diagnosis not present

## 2019-10-10 DIAGNOSIS — Z0000 Encounter for general adult medical examination without abnormal findings: Secondary | ICD-10-CM | POA: Diagnosis not present

## 2019-12-29 DIAGNOSIS — M79671 Pain in right foot: Secondary | ICD-10-CM | POA: Diagnosis not present

## 2019-12-29 DIAGNOSIS — M19071 Primary osteoarthritis, right ankle and foot: Secondary | ICD-10-CM | POA: Diagnosis not present

## 2019-12-29 DIAGNOSIS — M7731 Calcaneal spur, right foot: Secondary | ICD-10-CM | POA: Diagnosis not present

## 2020-02-06 DIAGNOSIS — Z1231 Encounter for screening mammogram for malignant neoplasm of breast: Secondary | ICD-10-CM | POA: Diagnosis not present

## 2020-02-06 DIAGNOSIS — Z6822 Body mass index (BMI) 22.0-22.9, adult: Secondary | ICD-10-CM | POA: Diagnosis not present

## 2020-02-06 DIAGNOSIS — Z01419 Encounter for gynecological examination (general) (routine) without abnormal findings: Secondary | ICD-10-CM | POA: Diagnosis not present

## 2020-04-30 DIAGNOSIS — D2262 Melanocytic nevi of left upper limb, including shoulder: Secondary | ICD-10-CM | POA: Diagnosis not present

## 2020-04-30 DIAGNOSIS — D225 Melanocytic nevi of trunk: Secondary | ICD-10-CM | POA: Diagnosis not present

## 2020-04-30 DIAGNOSIS — D2272 Melanocytic nevi of left lower limb, including hip: Secondary | ICD-10-CM | POA: Diagnosis not present

## 2020-04-30 DIAGNOSIS — D2261 Melanocytic nevi of right upper limb, including shoulder: Secondary | ICD-10-CM | POA: Diagnosis not present

## 2020-04-30 DIAGNOSIS — D485 Neoplasm of uncertain behavior of skin: Secondary | ICD-10-CM | POA: Diagnosis not present

## 2020-05-16 DIAGNOSIS — T1501XA Foreign body in cornea, right eye, initial encounter: Secondary | ICD-10-CM | POA: Diagnosis not present

## 2020-05-23 DIAGNOSIS — L988 Other specified disorders of the skin and subcutaneous tissue: Secondary | ICD-10-CM | POA: Diagnosis not present

## 2020-05-23 DIAGNOSIS — D485 Neoplasm of uncertain behavior of skin: Secondary | ICD-10-CM | POA: Diagnosis not present

## 2020-10-29 DIAGNOSIS — Z0000 Encounter for general adult medical examination without abnormal findings: Secondary | ICD-10-CM | POA: Diagnosis not present

## 2020-10-29 DIAGNOSIS — R011 Cardiac murmur, unspecified: Secondary | ICD-10-CM | POA: Diagnosis not present

## 2020-11-07 DIAGNOSIS — K5900 Constipation, unspecified: Secondary | ICD-10-CM | POA: Diagnosis not present

## 2020-11-07 DIAGNOSIS — Z1211 Encounter for screening for malignant neoplasm of colon: Secondary | ICD-10-CM | POA: Diagnosis not present

## 2020-11-14 DIAGNOSIS — R011 Cardiac murmur, unspecified: Secondary | ICD-10-CM | POA: Diagnosis not present

## 2021-01-13 DIAGNOSIS — Z1211 Encounter for screening for malignant neoplasm of colon: Secondary | ICD-10-CM | POA: Diagnosis not present

## 2021-04-03 DIAGNOSIS — K080 Exfoliation of teeth due to systemic causes: Secondary | ICD-10-CM | POA: Diagnosis not present

## 2021-04-30 DIAGNOSIS — D2262 Melanocytic nevi of left upper limb, including shoulder: Secondary | ICD-10-CM | POA: Diagnosis not present

## 2021-04-30 DIAGNOSIS — Z01419 Encounter for gynecological examination (general) (routine) without abnormal findings: Secondary | ICD-10-CM | POA: Diagnosis not present

## 2021-04-30 DIAGNOSIS — D485 Neoplasm of uncertain behavior of skin: Secondary | ICD-10-CM | POA: Diagnosis not present

## 2021-04-30 DIAGNOSIS — D2261 Melanocytic nevi of right upper limb, including shoulder: Secondary | ICD-10-CM | POA: Diagnosis not present

## 2021-04-30 DIAGNOSIS — D225 Melanocytic nevi of trunk: Secondary | ICD-10-CM | POA: Diagnosis not present

## 2021-04-30 DIAGNOSIS — Z1231 Encounter for screening mammogram for malignant neoplasm of breast: Secondary | ICD-10-CM | POA: Diagnosis not present

## 2021-07-03 DIAGNOSIS — G9331 Postviral fatigue syndrome: Secondary | ICD-10-CM | POA: Diagnosis not present

## 2021-07-03 DIAGNOSIS — R5383 Other fatigue: Secondary | ICD-10-CM | POA: Diagnosis not present

## 2021-07-03 DIAGNOSIS — R42 Dizziness and giddiness: Secondary | ICD-10-CM | POA: Diagnosis not present

## 2021-09-08 DIAGNOSIS — R42 Dizziness and giddiness: Secondary | ICD-10-CM | POA: Diagnosis not present

## 2021-09-08 DIAGNOSIS — R5383 Other fatigue: Secondary | ICD-10-CM | POA: Diagnosis not present

## 2021-09-25 ENCOUNTER — Ambulatory Visit: Payer: Federal, State, Local not specified - PPO | Admitting: Neurology

## 2021-09-25 ENCOUNTER — Encounter: Payer: Self-pay | Admitting: Neurology

## 2021-09-25 VITALS — BP 133/85 | HR 78 | Wt 148.0 lb

## 2021-09-25 DIAGNOSIS — H9311 Tinnitus, right ear: Secondary | ICD-10-CM | POA: Diagnosis not present

## 2021-09-25 DIAGNOSIS — R42 Dizziness and giddiness: Secondary | ICD-10-CM

## 2021-09-25 NOTE — Patient Instructions (Signed)
Increase fluid intake ?MRI brain without contrast to rule out any intracranial abnormality.  I will contact you to go over the result, if normal then consider ENT referral if symptoms are not improved ?Return as needed ?

## 2021-09-25 NOTE — Progress Notes (Signed)
? ?[facility] ? ?PATIENT: Cheyenne White ?DOB: July 24, 1960 ? ?REQUESTING CLINICIAN: Deon Pilling, NP ?HISTORY FROM: Patient  ?REASON FOR VISIT: Dizziness  ? ? ?HISTORICAL ? ?CHIEF COMPLAINT:  ?Chief Complaint  ?Patient presents with  ? New Patient (Initial Visit)  ?  Rm 13, ?NX [place]/Paper Proficient/Katie Hall Busing NP [employer] [phone]/Dizziness, lightheadedness, mild tinnitus x 2-3 mos ? ?Today c/o dizziness, and balance issues    ? ? ?HISTORY OF PRESENT ILLNESS:  ?This is a 61 year old woman with no reported past medical history who is presenting with complaint of dizziness since January.  Patient described the dizziness as feeling on the boat, she stated it is constant.  Patient reported the only time she gets relief is when she is in fact driving.  In the past month the episode felt stronger, with nausea but no vomiting and no falls.  She denies any history of hearing loss but have occasional tinnitus in the right ear.  Denies any recent infection and no other complaint.   ?She reported history of vertigo but states that these episode is different from her previous vertigo ?She has follow-up with her primary care doctor who tried her on meclizine, she said the medication make her sleepy but she has not obtained any relief. ? ? ?OTHER MEDICAL CONDITIONS: None  ? ? ?REVIEW OF SYSTEMS: Full 14 system review of systems performed and negative with exception of: as noted in the HPI  ? ?ALLERGIES: ?Allergies  ?Allergen Reactions  ? Penicillins Rash  ? ? ?HOME MEDICATIONS: ?Outpatient Medications Prior to Visit  ?Medication Sig Dispense Refill  ? meclizine (ANTIVERT) 25 MG tablet Take 25 mg by mouth 3 (three) times daily as needed.    ? Multiple Vitamins-Minerals (MULTIVITAMIN ADULT EXTRA C PO) multivitamin    ? Cyanocobalamin (VITAMIN B 12 PO) Take 100 mg by mouth.    ? Omega-3 Fatty Acids (FISH OIL) 1000 MG CAPS Take 1,000 mg by mouth.    ? ?No facility-administered medications  prior to visit.  ? ? ?PAST MEDICAL HISTORY: ?Past Medical History:  ?Diagnosis Date  ? Menopause   ? Thyroid nodule   ? ? ?PAST SURGICAL HISTORY: ?Past Surgical History:  ?Procedure Laterality Date  ? COLONOSCOPY  11/14/2010  ? ? ?FAMILY HISTORY: ?Family History  ?Problem Relation Age of Onset  ? Diabetes Mother   ? Cancer Sister   ?     Ovarian  ? ? ?SOCIAL HISTORY: ?Social History  ? ?Socioeconomic History  ? Marital status: Married  ?  Spouse name: Not on file  ? Number of children: Not on file  ? Years of education: Not on file  ? Highest education level: Not on file  ?Occupational History  ? Not on file  ?Tobacco Use  ? Smoking status: Never  ? Smokeless tobacco: Never  ?Substance and Sexual Activity  ? Alcohol use: Yes  ?  Comment: 3 per week  ? Drug use: No  ? Sexual activity: Not on file  ?Other Topics Concern  ? Not on file  ?Social History Narrative  ? Patient is married with 1 child  ? Patient is right handed  ? Patient's education level is Bachelor's degree  ? Patient drinks 3 cups of caffeine daily  ? ?Social Determinants of Health  ? ?Financial Resource Strain: Not on file  ?Food Insecurity: Not on file  ?Transportation Needs: Not on file  ?Physical Activity: Not on file  ?Stress: Not on file  ?Social Connections: Not on  file  ?Intimate Partner Violence: Not on file  ? ? ?PHYSICAL EXAM ? ?GENERAL EXAM/CONSTITUTIONAL: ?Vitals:  ?Vitals:  ? 09/25/21 0829  ?BP: 133/85  ?Pulse: 78  ?Weight: 148 lb (67.1 kg)  ? ?Body mass index is 23.18 kg/m?. ?Wt Readings from Last 3 Encounters:  ?09/25/21 148 lb (67.1 kg)  ?11/20/13 164 lb (74.4 kg)  ?07/25/10 167 lb (75.8 kg)  ? ?No data found. ? ? ?Patient is in no distress; well developed, nourished and groomed; neck is supple ?Right external ear canal is erythematous, no discharges, no cerumen noted.  ? ? ?EYES: ?Pupils round and reactive to light, Visual fields full to confrontation, Extraocular movements intacts,  ? ?MUSCULOSKELETAL: ?Gait, strength, tone, movements  noted in Neurologic exam below ? ?NEUROLOGIC: ?MENTAL STATUS:  ?   ? View : No data to display.  ?  ?  ?  ? ?awake, alert, oriented to person, place and time ?recent and remote memory intact ?normal attention and concentration ?language fluent, comprehension intact, naming intact ?fund of knowledge appropriate ? ?CRANIAL NERVE:  ?2nd, 3rd, 4th, 6th - pupils equal and reactive to light, visual fields full to confrontation, extraocular muscles intact, a few horizontal nystagmus  with rightward gaze. ?5th - facial sensation symmetric ?7th - facial strength symmetric ?8th - hearing intact ?9th - palate elevates symmetrically, uvula midline ?11th - shoulder shrug symmetric ?12th - tongue protrusion midline ? ?MOTOR:  ?normal bulk and tone, full strength in the BUE, BLE ? ?SENSORY:  ?normal and symmetric to light touch, pinprick, temperature, vibration ? ?COORDINATION:  ?finger-nose-finger, fine finger movements normal ? ?REFLEXES:  ?deep tendon reflexes present and symmetric ? ?GAIT/STATION:  ?normal ? ? ?DIAGNOSTIC DATA (LABS, IMAGING, TESTING) ?- I reviewed patient records, labs, notes, testing and imaging myself where available. ? ?No results found for: WBC, HGB, HCT, MCV, PLT ?No results found for: NA, K, CL, CO2, GLUCOSE, BUN, CREATININE, CALCIUM, PROT, ALBUMIN, AST, ALT, ALKPHOS, BILITOT, GFRNONAA, GFRAA ?No results found for: CHOL, HDL, LDLCALC, LDLDIRECT, TRIG, CHOLHDL ?No results found for: HGBA1C ?No results found for: VITAMINB12 ?Lab Results  ?Component Value Date  ? TSH 2.400 11/20/2013  ? ? ? ?ASSESSMENT AND PLAN ? ?61 y.o. year old female with no reported past medical history who is presenting with 20-month history of constant dizziness that she describes as being unsteady and feeling like on the boat.  It is ASSOCIATED with occasional nausea and right ear tinnitus but no vomiting, no hearing loss and no fall.  On exam she does have a few horizontal beating nystagmus on the right and right external ear canal  erythema.  Her orthostatic vitals were normal.  I suspect she has a peripheral etiology but due to constant symptom for the past 4 months I will proceed with MRI brain to rule out any intracranial abnormality.  I will contact the patient to go over the result and if normal she can follow-up with primary care doctor and they can consider referral to ENT if her symptoms are not improving. ? ? ? ?1. Dizziness   ?2. Vertigo   ?3. Tinnitus of right ear   ? ? ? ?Patient Instructions  ?Increase fluid intake ?MRI brain without contrast to rule out any intracranial abnormality.  I will contact you to go over the result, if normal then consider ENT referral if symptoms are not improved ?Return as needed ? ?Orders Placed This Encounter  ?Procedures  ? MR BRAIN WO CONTRAST  ? ? ?No orders of the  defined types were placed in this encounter. ? ? ?Return if symptoms worsen or fail to improve. ? ?I have spent a total of 47 minutes dedicated to this patient today, preparing to see patient, performing a medically appropriate examination and evaluation, ordering tests and/or medications and procedures, and counseling and educating the patient/family/caregiver; independently interpreting result and communicating results to the family/patient/caregiver; and documenting clinical information in the electronic medical record. ? ? ?Alric Ran, MD 09/25/2021, 9:19 AM ? ?[facility] ?[address], [address] ?[place], [place] [address] ?[phone] ? ?

## 2021-10-01 ENCOUNTER — Telehealth: Payer: Self-pay | Admitting: Neurology

## 2021-10-01 NOTE — Telephone Encounter (Signed)
30 mins MRI Brain wo contrast Dr. Sena Slate Fed. NPR ref: [id number] ?Scheduled with patient for 10/08/21 at 7:30am at [facility]. ?

## 2021-10-08 ENCOUNTER — Ambulatory Visit: Payer: Federal, State, Local not specified - PPO

## 2021-10-08 DIAGNOSIS — R42 Dizziness and giddiness: Secondary | ICD-10-CM

## 2021-10-27 DIAGNOSIS — R42 Dizziness and giddiness: Secondary | ICD-10-CM | POA: Diagnosis not present

## 2021-11-25 DIAGNOSIS — R42 Dizziness and giddiness: Secondary | ICD-10-CM | POA: Diagnosis not present

## 2021-11-25 DIAGNOSIS — I351 Nonrheumatic aortic (valve) insufficiency: Secondary | ICD-10-CM | POA: Diagnosis not present

## 2021-11-25 DIAGNOSIS — Z0000 Encounter for general adult medical examination without abnormal findings: Secondary | ICD-10-CM | POA: Diagnosis not present

## 2021-12-25 DIAGNOSIS — G43809 Other migraine, not intractable, without status migrainosus: Secondary | ICD-10-CM | POA: Diagnosis not present

## 2022-03-20 DIAGNOSIS — Z23 Encounter for immunization: Secondary | ICD-10-CM | POA: Diagnosis not present

## 2022-05-06 DIAGNOSIS — D225 Melanocytic nevi of trunk: Secondary | ICD-10-CM | POA: Diagnosis not present

## 2022-05-06 DIAGNOSIS — D224 Melanocytic nevi of scalp and neck: Secondary | ICD-10-CM | POA: Diagnosis not present

## 2022-05-06 DIAGNOSIS — D2261 Melanocytic nevi of right upper limb, including shoulder: Secondary | ICD-10-CM | POA: Diagnosis not present

## 2022-05-06 DIAGNOSIS — D2262 Melanocytic nevi of left upper limb, including shoulder: Secondary | ICD-10-CM | POA: Diagnosis not present

## 2022-07-09 DIAGNOSIS — G43809 Other migraine, not intractable, without status migrainosus: Secondary | ICD-10-CM | POA: Diagnosis not present

## 2022-10-21 DIAGNOSIS — G43809 Other migraine, not intractable, without status migrainosus: Secondary | ICD-10-CM | POA: Diagnosis not present

## 2022-10-21 DIAGNOSIS — R42 Dizziness and giddiness: Secondary | ICD-10-CM | POA: Diagnosis not present

## 2022-12-08 DIAGNOSIS — R5383 Other fatigue: Secondary | ICD-10-CM | POA: Diagnosis not present

## 2022-12-08 DIAGNOSIS — E041 Nontoxic single thyroid nodule: Secondary | ICD-10-CM | POA: Diagnosis not present

## 2022-12-08 DIAGNOSIS — Z0000 Encounter for general adult medical examination without abnormal findings: Secondary | ICD-10-CM | POA: Diagnosis not present

## 2022-12-15 DIAGNOSIS — Z0000 Encounter for general adult medical examination without abnormal findings: Secondary | ICD-10-CM | POA: Diagnosis not present

## 2022-12-15 DIAGNOSIS — Z23 Encounter for immunization: Secondary | ICD-10-CM | POA: Diagnosis not present

## 2022-12-15 DIAGNOSIS — I351 Nonrheumatic aortic (valve) insufficiency: Secondary | ICD-10-CM | POA: Diagnosis not present

## 2023-02-26 DIAGNOSIS — R29810 Facial weakness: Secondary | ICD-10-CM | POA: Diagnosis not present

## 2023-02-26 DIAGNOSIS — R42 Dizziness and giddiness: Secondary | ICD-10-CM | POA: Diagnosis not present

## 2023-03-30 DIAGNOSIS — H02403 Unspecified ptosis of bilateral eyelids: Secondary | ICD-10-CM | POA: Diagnosis not present

## 2023-04-27 DIAGNOSIS — R42 Dizziness and giddiness: Secondary | ICD-10-CM | POA: Diagnosis not present

## 2023-05-10 DIAGNOSIS — D2262 Melanocytic nevi of left upper limb, including shoulder: Secondary | ICD-10-CM | POA: Diagnosis not present

## 2023-05-10 DIAGNOSIS — D224 Melanocytic nevi of scalp and neck: Secondary | ICD-10-CM | POA: Diagnosis not present

## 2023-05-10 DIAGNOSIS — D2261 Melanocytic nevi of right upper limb, including shoulder: Secondary | ICD-10-CM | POA: Diagnosis not present

## 2023-05-10 DIAGNOSIS — D225 Melanocytic nevi of trunk: Secondary | ICD-10-CM | POA: Diagnosis not present

## 2023-08-02 DIAGNOSIS — R42 Dizziness and giddiness: Secondary | ICD-10-CM | POA: Diagnosis not present

## 2023-08-18 DIAGNOSIS — H02401 Unspecified ptosis of right eyelid: Secondary | ICD-10-CM | POA: Diagnosis not present

## 2023-08-18 DIAGNOSIS — H02403 Unspecified ptosis of bilateral eyelids: Secondary | ICD-10-CM | POA: Diagnosis not present

## 2023-08-25 DIAGNOSIS — Z48810 Encounter for surgical aftercare following surgery on the sense organs: Secondary | ICD-10-CM | POA: Diagnosis not present

## 2023-08-27 DIAGNOSIS — R42 Dizziness and giddiness: Secondary | ICD-10-CM | POA: Diagnosis not present

## 2023-09-01 DIAGNOSIS — R42 Dizziness and giddiness: Secondary | ICD-10-CM | POA: Diagnosis not present

## 2023-09-03 DIAGNOSIS — R42 Dizziness and giddiness: Secondary | ICD-10-CM | POA: Diagnosis not present

## 2023-09-06 DIAGNOSIS — R42 Dizziness and giddiness: Secondary | ICD-10-CM | POA: Diagnosis not present

## 2023-09-08 DIAGNOSIS — R42 Dizziness and giddiness: Secondary | ICD-10-CM | POA: Diagnosis not present

## 2023-09-13 DIAGNOSIS — R42 Dizziness and giddiness: Secondary | ICD-10-CM | POA: Diagnosis not present

## 2023-09-20 DIAGNOSIS — R42 Dizziness and giddiness: Secondary | ICD-10-CM | POA: Diagnosis not present

## 2023-09-27 DIAGNOSIS — R42 Dizziness and giddiness: Secondary | ICD-10-CM | POA: Diagnosis not present

## 2023-10-08 DIAGNOSIS — R42 Dizziness and giddiness: Secondary | ICD-10-CM | POA: Diagnosis not present

## 2023-10-15 DIAGNOSIS — R42 Dizziness and giddiness: Secondary | ICD-10-CM | POA: Diagnosis not present

## 2023-10-22 DIAGNOSIS — R42 Dizziness and giddiness: Secondary | ICD-10-CM | POA: Diagnosis not present

## 2023-10-29 DIAGNOSIS — R42 Dizziness and giddiness: Secondary | ICD-10-CM | POA: Diagnosis not present

## 2023-11-02 DIAGNOSIS — Z48810 Encounter for surgical aftercare following surgery on the sense organs: Secondary | ICD-10-CM | POA: Diagnosis not present

## 2023-11-03 DIAGNOSIS — R42 Dizziness and giddiness: Secondary | ICD-10-CM | POA: Diagnosis not present

## 2023-12-13 DIAGNOSIS — Z0000 Encounter for general adult medical examination without abnormal findings: Secondary | ICD-10-CM | POA: Diagnosis not present

## 2023-12-13 DIAGNOSIS — E041 Nontoxic single thyroid nodule: Secondary | ICD-10-CM | POA: Diagnosis not present

## 2023-12-20 DIAGNOSIS — I351 Nonrheumatic aortic (valve) insufficiency: Secondary | ICD-10-CM | POA: Diagnosis not present

## 2023-12-20 DIAGNOSIS — Z0000 Encounter for general adult medical examination without abnormal findings: Secondary | ICD-10-CM | POA: Diagnosis not present

## 2024-04-18 DIAGNOSIS — Z1231 Encounter for screening mammogram for malignant neoplasm of breast: Secondary | ICD-10-CM | POA: Diagnosis not present

## 2024-04-18 DIAGNOSIS — Z01419 Encounter for gynecological examination (general) (routine) without abnormal findings: Secondary | ICD-10-CM | POA: Diagnosis not present

## 2024-04-18 DIAGNOSIS — Z124 Encounter for screening for malignant neoplasm of cervix: Secondary | ICD-10-CM | POA: Diagnosis not present

## 2024-05-10 DIAGNOSIS — D2262 Melanocytic nevi of left upper limb, including shoulder: Secondary | ICD-10-CM | POA: Diagnosis not present

## 2024-05-10 DIAGNOSIS — D225 Melanocytic nevi of trunk: Secondary | ICD-10-CM | POA: Diagnosis not present

## 2024-05-10 DIAGNOSIS — D2271 Melanocytic nevi of right lower limb, including hip: Secondary | ICD-10-CM | POA: Diagnosis not present

## 2024-05-10 DIAGNOSIS — L821 Other seborrheic keratosis: Secondary | ICD-10-CM | POA: Diagnosis not present
